# Patient Record
Sex: Male | Born: 1935 | Race: White | Hispanic: No | Marital: Married | Smoking: Former smoker
Health system: Southern US, Community
[De-identification: ages and names within clinical notes are randomized; demographics above are authoritative.]

## PROBLEM LIST (undated history)

## (undated) DIAGNOSIS — C4359 Malignant melanoma of other part of trunk: Secondary | ICD-10-CM

## (undated) DIAGNOSIS — M1990 Unspecified osteoarthritis, unspecified site: Secondary | ICD-10-CM

## (undated) DIAGNOSIS — E039 Hypothyroidism, unspecified: Secondary | ICD-10-CM

## (undated) DIAGNOSIS — I10 Essential (primary) hypertension: Secondary | ICD-10-CM

## (undated) DIAGNOSIS — K219 Gastro-esophageal reflux disease without esophagitis: Secondary | ICD-10-CM

## (undated) HISTORY — PX: TONSILLECTOMY: SUR1361

## (undated) HISTORY — PX: JOINT REPLACEMENT: SHX530

## (undated) HISTORY — PX: MULTIPLE TOOTH EXTRACTIONS: SHX2053

## (undated) HISTORY — PX: KNEE CARTILAGE SURGERY: SHX688

## (undated) HISTORY — PX: MELANOMA EXCISION: SHX5266

## (undated) HISTORY — PX: COLONOSCOPY: SHX174

---

## 2016-03-24 ENCOUNTER — Ambulatory Visit (HOSPITAL_BASED_OUTPATIENT_CLINIC_OR_DEPARTMENT_OTHER)
Admission: RE | Admit: 2016-03-24 | Discharge: 2016-03-24 | Disposition: A | Discharge status: Home / Self Care | Payer: Medicare HMO | Source: Ambulatory Visit | Attending: Orthopaedic Surgery | Admitting: Orthopaedic Surgery

## 2016-03-24 ENCOUNTER — Other Ambulatory Visit (INDEPENDENT_AMBULATORY_CARE_PROVIDER_SITE_OTHER): Payer: Self-pay | Admitting: Orthopaedic Surgery

## 2016-03-24 ENCOUNTER — Ambulatory Visit (INDEPENDENT_AMBULATORY_CARE_PROVIDER_SITE_OTHER): Payer: Medicare HMO | Admitting: Orthopaedic Surgery

## 2016-03-24 DIAGNOSIS — M1711 Unilateral primary osteoarthritis, right knee: Secondary | ICD-10-CM | POA: Insufficient documentation

## 2016-03-24 DIAGNOSIS — R52 Pain, unspecified: Secondary | ICD-10-CM

## 2016-03-24 DIAGNOSIS — M25461 Effusion, right knee: Secondary | ICD-10-CM | POA: Diagnosis not present

## 2016-03-24 DIAGNOSIS — M11261 Other chondrocalcinosis, right knee: Secondary | ICD-10-CM | POA: Insufficient documentation

## 2016-03-24 DIAGNOSIS — M25569 Pain in unspecified knee: Secondary | ICD-10-CM | POA: Diagnosis present

## 2016-03-24 DIAGNOSIS — M25561 Pain in right knee: Secondary | ICD-10-CM

## 2016-04-21 ENCOUNTER — Ambulatory Visit (INDEPENDENT_AMBULATORY_CARE_PROVIDER_SITE_OTHER): Payer: Medicare HMO | Admitting: Orthopaedic Surgery

## 2016-04-21 DIAGNOSIS — M1711 Unilateral primary osteoarthritis, right knee: Secondary | ICD-10-CM

## 2016-04-21 MED ORDER — HYALURONAN 88 MG/4ML IX SOSY
88.0000 mg | PREFILLED_SYRINGE | INTRA_ARTICULAR | Status: AC | PRN
  Administered 2016-04-21: 88 mg via INTRA_ARTICULAR

## 2016-04-21 NOTE — Progress Notes (Signed)
   Procedure Note  Patient: Shane Jordan             Date of Birth: November 22, 1932           MRN: [id number]             Visit Date: 04/21/2016  Procedures: Visit Diagnoses: Osteoarthritis of right knee, unspecified osteoarthritis type - Plan: Large Joint Injection/Arthrocentesis  Large Joint Inj Date/Time: 04/21/2016 8:40 AM Performed by: Anise Salvo Authorized by: Mcarthur Rossetti   Consent Given by:  Patient Indications:  Pain Location:  Knee Site:  R knee Needle Size:  22 G Ultrasound Guidance: No   Fluoroscopic Guidance: No   Arthrogram: No   Medications:  88 mg Hyaluronan 88 MG/4ML

## 2016-04-21 NOTE — Progress Notes (Signed)
   Office Visit Note   Patient: Shane Jordan           Date of Birth: 1932-08-15           MRN: [id number] Visit Date: 04/21/2016              Requested by: Reita Cliche, MD No address on file PCP: Reita Cliche, MD   Assessment & Plan: Visit Diagnoses:  1. Osteoarthritis of right knee, unspecified osteoarthritis type     Plan: He tolerated the monomer this injection in his right knee without difficulty. He is more [place] strengthening exercises. I will see him back in about 6 weeks to see if this is adding effect on him at all.  Follow-Up Instructions: Return in about 6 weeks (around 06/02/2016).   Orders:  Orders Placed This Encounter  Procedures  . Large Joint Injection/Arthrocentesis   No orders of the defined types were placed in this encounter.     Procedures: No procedures performed   Clinical Data: No additional findings.   Subjective: No chief complaint on file.   HPI  Review of Systems   Objective: Vital Signs: There were no vitals taken for this visit.  Physical Exam  Ortho Exam  Specialty Comments:  No specialty comments available.  Imaging: No results found.   PMFS History: There are no active problems to display for this patient.  No past medical history on file.  No family history on file.  No past surgical history on file. Social History   Occupational History  . Not on file.   Social History Main Topics  . Smoking status: Not on file  . Smokeless tobacco: Not on file  . Alcohol use Not on file  . Drug use: Unknown  . Sexual activity: Not on file

## 2016-06-16 ENCOUNTER — Ambulatory Visit (INDEPENDENT_AMBULATORY_CARE_PROVIDER_SITE_OTHER): Payer: Medicare HMO | Admitting: Orthopaedic Surgery

## 2016-06-16 DIAGNOSIS — M1711 Unilateral primary osteoarthritis, right knee: Secondary | ICD-10-CM

## 2016-06-16 DIAGNOSIS — G8929 Other chronic pain: Secondary | ICD-10-CM | POA: Diagnosis not present

## 2016-06-16 DIAGNOSIS — M25561 Pain in right knee: Secondary | ICD-10-CM | POA: Diagnosis not present

## 2016-06-16 NOTE — Progress Notes (Signed)
The patient is a very pleasant 83 year old well known to me. He has no significant active medical problems. We've been following him for severe osteoarthritis and degenerative joint disease of his right knee. He is tried and failed all forms conservative treatment including activity modification, rest, anti-inflammatories, intra-articular steroid injection, and a hyaluronic acid injection. His pain is daily. His detrimentally affected his activities daily living, his mobility, and his quality of life. He can be 10 out of 10 at times now. Is been going on for several years but worsening over the last several months. He is an avid golfer and was to be active again. At this point he was consider knee replacement surgery.  He denies any chest pain, shortness of breath, fever, chills, nausea, vomiting.  He is alert and oriented 3. Examination of his right knee shows patellofemoral crepitation and a varus deformity. He lacks full extension by a few degrees but I can flex him back 120. His knee feels ligaments stable. There is no significant effusion.  We did go over his x-rays again show this extensive tricompartmental arthritis involving his right knee.  At this point he does wish proceed knee replaced surgery. I'll long and thorough discussion showing him his knee x-rays and knee models as well as pictures of knee replacements. We discussed what is intraoperative and postoperative course and be. A thorough discussion of risk and benefits of surgery was had as well. He does wish to have this set up in the near future. We will work on getting this scheduled no we will see him back in 2 weeks postoperative but no x-rays needed.

## 2016-07-08 ENCOUNTER — Other Ambulatory Visit (INDEPENDENT_AMBULATORY_CARE_PROVIDER_SITE_OTHER): Payer: Self-pay | Admitting: Physician Assistant

## 2016-07-10 NOTE — Pre-Procedure Instructions (Signed)
Shane Jordan  07/10/2016      [place], [place] - [address] [place] [address] Phone: [phone] Fax: [phone]    Your procedure is scheduled on February 13  Report to [facility] at 1050 A.M.  Call this number if you have problems the morning of surgery:  [phone]   Remember:  Do not eat food or drink liquids after midnight.   Take these medicines the morning of surgery with A SIP OF WATER amLODipine (NORVASC) levothyroxine (SYNTHROID, LEVOTHROID)  7 days prior to surgery STOP taking any Aspirin, Aleve, Naproxen, Ibuprofen, Motrin, Advil, Goody's, BC's, all herbal medications, fish oil, and all vitamins    Do not wear jewelry.  Do not wear lotions, powders, or cologne, or deoderant.  Men may shave face and neck.  Do not bring valuables to the hospital.  [facility] is not responsible for any belongings or valuables.  Contacts, dentures or bridgework may not be worn into surgery.  Leave your suitcase in the car.  After surgery it may be brought to your room.  For patients admitted to the hospital, discharge time will be determined by your treatment team.  Patients discharged the day of surgery will not be allowed to drive home.    Special instructions:   [facility]- Preparing For Surgery  Before surgery, you can play an important role. Because skin is not sterile, your skin needs to be as free of germs as possible. You can reduce the number of germs on your skin by washing with CHG (chlorahexidine gluconate) Soap before surgery.  CHG is an antiseptic cleaner which kills germs and bonds with the skin to continue killing germs even after washing.  Please do not use if you have an allergy to CHG or antibacterial soaps. If your skin becomes reddened/irritated stop using the CHG.  Do not shave (including legs and underarms) for at least 48 hours prior to first CHG shower. It is OK to shave your  face.  Please follow these instructions carefully.   1. Shower the NIGHT BEFORE SURGERY and the MORNING OF SURGERY with CHG.   2. If you chose to wash your hair, wash your hair first as usual with your normal shampoo.  3. After you shampoo, rinse your hair and body thoroughly to remove the shampoo.  4. Use CHG as you would any other liquid soap. You can apply CHG directly to the skin and wash gently with a scrungie or a clean washcloth.   5. Apply the CHG Soap to your body ONLY FROM THE NECK DOWN.  Do not use on open wounds or open sores. Avoid contact with your eyes, ears, mouth and genitals (private parts). Wash genitals (private parts) with your normal soap.  6. Wash thoroughly, paying special attention to the area where your surgery will be performed.  7. Thoroughly rinse your body with warm water from the neck down.  8. DO NOT shower/wash with your normal soap after using and rinsing off the CHG Soap.  9. Pat yourself dry with a CLEAN TOWEL.   10. Wear CLEAN PAJAMAS   11. Place CLEAN SHEETS on your bed the night of your first shower and DO NOT SLEEP WITH PETS.    Day of Surgery: Do not apply any deodorants/lotions. Please wear clean clothes to the hospital/surgery center.      Please read over the following fact sheets that you were given.

## 2016-07-11 ENCOUNTER — Encounter (HOSPITAL_COMMUNITY): Payer: Self-pay

## 2016-07-11 ENCOUNTER — Encounter (HOSPITAL_COMMUNITY)
Admission: RE | Admit: 2016-07-11 | Discharge: 2016-07-11 | Disposition: A | Discharge status: Home / Self Care | Payer: Medicare HMO | Source: Ambulatory Visit | Attending: Orthopaedic Surgery | Admitting: Orthopaedic Surgery

## 2016-07-11 DIAGNOSIS — K219 Gastro-esophageal reflux disease without esophagitis: Secondary | ICD-10-CM | POA: Diagnosis not present

## 2016-07-11 DIAGNOSIS — Z85828 Personal history of other malignant neoplasm of skin: Secondary | ICD-10-CM | POA: Insufficient documentation

## 2016-07-11 DIAGNOSIS — Z01812 Encounter for preprocedural laboratory examination: Secondary | ICD-10-CM | POA: Diagnosis present

## 2016-07-11 DIAGNOSIS — I10 Essential (primary) hypertension: Secondary | ICD-10-CM | POA: Insufficient documentation

## 2016-07-11 DIAGNOSIS — I252 Old myocardial infarction: Secondary | ICD-10-CM | POA: Diagnosis not present

## 2016-07-11 DIAGNOSIS — E039 Hypothyroidism, unspecified: Secondary | ICD-10-CM | POA: Insufficient documentation

## 2016-07-11 DIAGNOSIS — R001 Bradycardia, unspecified: Secondary | ICD-10-CM | POA: Diagnosis not present

## 2016-07-11 DIAGNOSIS — M1990 Unspecified osteoarthritis, unspecified site: Secondary | ICD-10-CM | POA: Diagnosis not present

## 2016-07-11 DIAGNOSIS — Z01810 Encounter for preprocedural cardiovascular examination: Secondary | ICD-10-CM | POA: Insufficient documentation

## 2016-07-11 HISTORY — DX: Hypothyroidism, unspecified: E03.9

## 2016-07-11 HISTORY — DX: Unspecified osteoarthritis, unspecified site: M19.90

## 2016-07-11 HISTORY — DX: Gastro-esophageal reflux disease without esophagitis: K21.9

## 2016-07-11 HISTORY — DX: Essential (primary) hypertension: I10

## 2016-07-11 LAB — CBC
HCT: 40.3 % (ref 39.0–52.0)
HEMOGLOBIN: 13.4 g/dL (ref 13.0–17.0)
MCH: 31.2 pg (ref 26.0–34.0)
MCHC: 33.3 g/dL (ref 30.0–36.0)
MCV: 93.9 fL (ref 78.0–100.0)
PLATELETS: 181 10*3/uL (ref 150–400)
RBC: 4.29 MIL/uL (ref 4.22–5.81)
RDW: 13.8 % (ref 11.5–15.5)
WBC: 5.9 10*3/uL (ref 4.0–10.5)

## 2016-07-11 LAB — BASIC METABOLIC PANEL
ANION GAP: 6 (ref 5–15)
BUN: 14 mg/dL (ref 6–20)
CALCIUM: 9.3 mg/dL (ref 8.9–10.3)
CO2: 26 mmol/L (ref 22–32)
CREATININE: 1 mg/dL (ref 0.61–1.24)
Chloride: 105 mmol/L (ref 101–111)
GFR calc Af Amer: >60 mL/min (ref >60)
GLUCOSE: 91 mg/dL (ref 65–99)
Potassium: 4.5 mmol/L (ref 3.5–5.1)
Sodium: 137 mmol/L (ref 135–145)

## 2016-07-11 LAB — SURGICAL PCR SCREEN
MRSA, PCR: NEGATIVE
Staphylococcus aureus: NEGATIVE

## 2016-07-11 NOTE — Progress Notes (Signed)
PCP - Reita Cliche Cardiologist - denies  Chest x-ray - not needed EKG - 07/11/16 Stress Test ->30 years   ECHO - 30 years Cardiac Cath - not needed Sleep study > 25 years  Requesting records from PCP will send to anesthesia for review of those records   Patient denies shortness of breath, fever, cough and chest pain at PAT appointment   Patient verbalized understanding of instructions that was given to them at the PAT appointment. Patient expressed that there were no further questions.  Patient was also instructed that they will need to review over the PAT instructions again at home before the surgery.

## 2016-07-21 MED ORDER — TRANEXAMIC ACID 1000 MG/10ML IV SOLN
1000.0000 mg | INTRAVENOUS | Status: AC
  Administered 2016-07-22: 1000 mg via INTRAVENOUS
  Filled 2016-07-21: qty 10

## 2016-07-22 ENCOUNTER — Encounter (HOSPITAL_COMMUNITY)
Admission: RE | Disposition: A | Discharge status: Home Health | Payer: Self-pay | Source: Ambulatory Visit | Attending: Orthopaedic Surgery

## 2016-07-22 ENCOUNTER — Encounter (HOSPITAL_COMMUNITY): Payer: Self-pay | Admitting: Certified Registered Nurse Anesthetist

## 2016-07-22 ENCOUNTER — Inpatient Hospital Stay (HOSPITAL_COMMUNITY): Payer: Medicare HMO | Admitting: Emergency Medicine

## 2016-07-22 ENCOUNTER — Inpatient Hospital Stay (HOSPITAL_COMMUNITY): Payer: Medicare HMO | Admitting: Certified Registered Nurse Anesthetist

## 2016-07-22 ENCOUNTER — Inpatient Hospital Stay (HOSPITAL_COMMUNITY): Payer: Medicare HMO

## 2016-07-22 ENCOUNTER — Inpatient Hospital Stay (HOSPITAL_COMMUNITY)
Admission: RE | Admit: 2016-07-22 | Discharge: 2016-07-24 | DRG: 470 | Disposition: A | Discharge status: Home Health | Payer: Medicare HMO | Source: Ambulatory Visit | Attending: Orthopaedic Surgery | Admitting: Orthopaedic Surgery

## 2016-07-22 DIAGNOSIS — Z85820 Personal history of malignant melanoma of skin: Secondary | ICD-10-CM | POA: Diagnosis not present

## 2016-07-22 DIAGNOSIS — Z87891 Personal history of nicotine dependence: Secondary | ICD-10-CM | POA: Diagnosis not present

## 2016-07-22 DIAGNOSIS — E039 Hypothyroidism, unspecified: Secondary | ICD-10-CM | POA: Diagnosis present

## 2016-07-22 DIAGNOSIS — K219 Gastro-esophageal reflux disease without esophagitis: Secondary | ICD-10-CM | POA: Diagnosis present

## 2016-07-22 DIAGNOSIS — M1711 Unilateral primary osteoarthritis, right knee: Secondary | ICD-10-CM | POA: Diagnosis present

## 2016-07-22 DIAGNOSIS — I10 Essential (primary) hypertension: Secondary | ICD-10-CM | POA: Diagnosis present

## 2016-07-22 DIAGNOSIS — Z79899 Other long term (current) drug therapy: Secondary | ICD-10-CM

## 2016-07-22 DIAGNOSIS — Z96651 Presence of right artificial knee joint: Secondary | ICD-10-CM

## 2016-07-22 HISTORY — PX: TOTAL KNEE ARTHROPLASTY: SHX125

## 2016-07-22 HISTORY — DX: Malignant melanoma of other part of trunk: C43.59

## 2016-07-22 SURGERY — ARTHROPLASTY, KNEE, TOTAL
Anesthesia: Spinal | Site: Knee | Laterality: Right

## 2016-07-22 MED ORDER — PROMETHAZINE HCL 25 MG/ML IJ SOLN: 6.2500 mg | INTRAMUSCULAR | Status: DC | PRN

## 2016-07-22 MED ORDER — EZETIMIBE 10 MG PO TABS
10.0000 mg | ORAL_TABLET | Freq: Every day | ORAL | Status: DC
  Administered 2016-07-23 – 2016-07-24 (×2): 10 mg via ORAL
  Filled 2016-07-22 (×3): qty 1

## 2016-07-22 MED ORDER — PROPOFOL 500 MG/50ML IV EMUL
INTRAVENOUS | Status: DC | PRN
  Administered 2016-07-22: 50 ug/kg/min via INTRAVENOUS

## 2016-07-22 MED ORDER — PANTOPRAZOLE SODIUM 40 MG PO TBEC
80.0000 mg | DELAYED_RELEASE_TABLET | Freq: Every day | ORAL | Status: DC
  Administered 2016-07-23 – 2016-07-24 (×2): 80 mg via ORAL
  Filled 2016-07-22 (×3): qty 2

## 2016-07-22 MED ORDER — ACETAMINOPHEN 325 MG PO TABS: 650.0000 mg | ORAL_TABLET | Freq: Four times a day (QID) | ORAL | Status: DC | PRN

## 2016-07-22 MED ORDER — CHLORHEXIDINE GLUCONATE 4 % EX LIQD: 60.0000 mL | Freq: Once | CUTANEOUS | Status: DC

## 2016-07-22 MED ORDER — FENTANYL CITRATE (PF) 100 MCG/2ML IJ SOLN
INTRAMUSCULAR | Status: AC
  Filled 2016-07-22: qty 2

## 2016-07-22 MED ORDER — DIPHENHYDRAMINE HCL 12.5 MG/5ML PO ELIX
12.5000 mg | ORAL_SOLUTION | ORAL | Status: DC | PRN
  Administered 2016-07-23: 25 mg via ORAL
  Filled 2016-07-22: qty 10

## 2016-07-22 MED ORDER — ROPIVACAINE HCL 7.5 MG/ML IJ SOLN
INTRAMUSCULAR | Status: DC | PRN
  Administered 2016-07-22: 20 mL via PERINEURAL

## 2016-07-22 MED ORDER — DOCUSATE SODIUM 100 MG PO CAPS
100.0000 mg | ORAL_CAPSULE | Freq: Two times a day (BID) | ORAL | Status: DC
  Administered 2016-07-22 – 2016-07-24 (×4): 100 mg via ORAL
  Filled 2016-07-22 (×4): qty 1

## 2016-07-22 MED ORDER — IRBESARTAN 300 MG PO TABS
300.0000 mg | ORAL_TABLET | Freq: Every day | ORAL | Status: DC
  Administered 2016-07-23 – 2016-07-24 (×2): 300 mg via ORAL
  Filled 2016-07-22 (×3): qty 1

## 2016-07-22 MED ORDER — METOCLOPRAMIDE HCL 5 MG/ML IJ SOLN: 5.0000 mg | Freq: Three times a day (TID) | INTRAMUSCULAR | Status: DC | PRN

## 2016-07-22 MED ORDER — AMLODIPINE BESYLATE 10 MG PO TABS
10.0000 mg | ORAL_TABLET | Freq: Every day | ORAL | Status: DC
  Administered 2016-07-23 – 2016-07-24 (×2): 10 mg via ORAL
  Filled 2016-07-22 (×2): qty 1

## 2016-07-22 MED ORDER — SODIUM CHLORIDE 0.9 % IV SOLN: INTRAVENOUS | Status: DC | PRN

## 2016-07-22 MED ORDER — SODIUM CHLORIDE 0.9 % IV SOLN
INTRAVENOUS | Status: DC
  Administered 2016-07-22: 15:00:00 via INTRAVENOUS

## 2016-07-22 MED ORDER — HYDROMORPHONE HCL 2 MG/ML IJ SOLN
0.5000 mg | INTRAMUSCULAR | Status: DC | PRN
  Administered 2016-07-22: 0.5 mg via INTRAVENOUS
  Filled 2016-07-22: qty 1

## 2016-07-22 MED ORDER — CEFAZOLIN SODIUM-DEXTROSE 2-4 GM/100ML-% IV SOLN
INTRAVENOUS | Status: AC
  Filled 2016-07-22: qty 100

## 2016-07-22 MED ORDER — ONDANSETRON HCL 4 MG PO TABS
4.0000 mg | ORAL_TABLET | Freq: Four times a day (QID) | ORAL | Status: DC | PRN
  Administered 2016-07-22: 4 mg via ORAL
  Filled 2016-07-22: qty 1

## 2016-07-22 MED ORDER — MIDAZOLAM HCL 2 MG/2ML IJ SOLN
INTRAMUSCULAR | Status: AC
  Filled 2016-07-22: qty 2

## 2016-07-22 MED ORDER — MENTHOL 3 MG MT LOZG: 1.0000 | LOZENGE | OROMUCOSAL | Status: DC | PRN

## 2016-07-22 MED ORDER — PHENOL 1.4 % MT LIQD: 1.0000 | OROMUCOSAL | Status: DC | PRN

## 2016-07-22 MED ORDER — OXYCODONE HCL 5 MG PO TABS
5.0000 mg | ORAL_TABLET | ORAL | Status: DC | PRN
  Administered 2016-07-22: 10 mg via ORAL
  Administered 2016-07-22: 5 mg via ORAL
  Administered 2016-07-23 (×4): 10 mg via ORAL
  Administered 2016-07-23 – 2016-07-24 (×2): 5 mg via ORAL
  Filled 2016-07-22: qty 2
  Filled 2016-07-22: qty 1
  Filled 2016-07-22: qty 2
  Filled 2016-07-22: qty 1
  Filled 2016-07-22: qty 2
  Filled 2016-07-22 (×2): qty 1
  Filled 2016-07-22 (×2): qty 2

## 2016-07-22 MED ORDER — LEVOTHYROXINE SODIUM 50 MCG PO TABS
50.0000 ug | ORAL_TABLET | Freq: Every day | ORAL | Status: DC
  Administered 2016-07-23 – 2016-07-24 (×2): 50 ug via ORAL
  Filled 2016-07-22 (×2): qty 1

## 2016-07-22 MED ORDER — EPHEDRINE SULFATE 50 MG/ML IJ SOLN
INTRAMUSCULAR | Status: DC | PRN
  Administered 2016-07-22 (×2): 10 mg via INTRAVENOUS
  Administered 2016-07-22 (×3): 5 mg via INTRAVENOUS

## 2016-07-22 MED ORDER — BUPIVACAINE HCL (PF) 0.75 % IJ SOLN
INTRAMUSCULAR | Status: DC | PRN
  Administered 2016-07-22: 1.8 mL via INTRATHECAL

## 2016-07-22 MED ORDER — METHOCARBAMOL 500 MG PO TABS
500.0000 mg | ORAL_TABLET | Freq: Four times a day (QID) | ORAL | Status: DC | PRN
  Administered 2016-07-23 (×4): 500 mg via ORAL
  Filled 2016-07-22 (×5): qty 1

## 2016-07-22 MED ORDER — METHOCARBAMOL 1000 MG/10ML IJ SOLN
500.0000 mg | Freq: Four times a day (QID) | INTRAVENOUS | Status: DC | PRN
  Filled 2016-07-22: qty 5

## 2016-07-22 MED ORDER — RIVAROXABAN 10 MG PO TABS
10.0000 mg | ORAL_TABLET | Freq: Every day | ORAL | Status: DC
  Administered 2016-07-23 – 2016-07-24 (×2): 10 mg via ORAL
  Filled 2016-07-22 (×2): qty 1

## 2016-07-22 MED ORDER — MIDAZOLAM HCL 2 MG/2ML IJ SOLN
INTRAMUSCULAR | Status: AC
  Administered 2016-07-22: 1 mg
  Filled 2016-07-22: qty 2

## 2016-07-22 MED ORDER — FENTANYL CITRATE (PF) 100 MCG/2ML IJ SOLN
INTRAMUSCULAR | Status: AC
  Administered 2016-07-22: 50 ug
  Filled 2016-07-22: qty 2

## 2016-07-22 MED ORDER — CEFAZOLIN SODIUM-DEXTROSE 2-4 GM/100ML-% IV SOLN
2.0000 g | INTRAVENOUS | Status: AC
  Administered 2016-07-22: 2 g via INTRAVENOUS

## 2016-07-22 MED ORDER — HYDROMORPHONE HCL 1 MG/ML IJ SOLN: 0.5000 mg | INTRAMUSCULAR | Status: DC | PRN

## 2016-07-22 MED ORDER — HYDROMORPHONE HCL 1 MG/ML IJ SOLN: 0.2500 mg | INTRAMUSCULAR | Status: DC | PRN

## 2016-07-22 MED ORDER — ONDANSETRON HCL 4 MG/2ML IJ SOLN: 4.0000 mg | Freq: Four times a day (QID) | INTRAMUSCULAR | Status: DC | PRN

## 2016-07-22 MED ORDER — ALUM & MAG HYDROXIDE-SIMETH 200-200-20 MG/5ML PO SUSP: 30.0000 mL | ORAL | Status: DC | PRN

## 2016-07-22 MED ORDER — LACTATED RINGERS IV SOLN
INTRAVENOUS | Status: DC
  Administered 2016-07-22: 11:00:00 via INTRAVENOUS

## 2016-07-22 MED ORDER — CEFAZOLIN IN D5W 1 GM/50ML IV SOLN
1.0000 g | Freq: Four times a day (QID) | INTRAVENOUS | Status: AC
  Administered 2016-07-22 (×2): 1 g via INTRAVENOUS
  Filled 2016-07-22 (×2): qty 50

## 2016-07-22 MED ORDER — ACETAMINOPHEN 650 MG RE SUPP: 650.0000 mg | Freq: Four times a day (QID) | RECTAL | Status: DC | PRN

## 2016-07-22 MED ORDER — 0.9 % SODIUM CHLORIDE (POUR BTL) OPTIME
TOPICAL | Status: DC | PRN
  Administered 2016-07-22: 1000 mL

## 2016-07-22 MED ORDER — METOCLOPRAMIDE HCL 5 MG PO TABS: 5.0000 mg | ORAL_TABLET | Freq: Three times a day (TID) | ORAL | Status: DC | PRN

## 2016-07-22 MED ORDER — SODIUM CHLORIDE 0.9 % IR SOLN
Status: DC | PRN
  Administered 2016-07-22: 3000 mL

## 2016-07-22 SURGICAL SUPPLY — 68 items
APL SKNCLS STERI-STRIP NONHPOA (GAUZE/BANDAGES/DRESSINGS) ×1
BANDAGE ACE 6X5 VEL STRL LF (GAUZE/BANDAGES/DRESSINGS) ×3 IMPLANT
BANDAGE ESMARK 6X9 LF (GAUZE/BANDAGES/DRESSINGS) ×1 IMPLANT
BENZOIN TINCTURE PRP APPL 2/3 (GAUZE/BANDAGES/DRESSINGS) ×1 IMPLANT
BLADE SAG 18X100X1.27 (BLADE) ×3 IMPLANT
BNDG CMPR 9X6 STRL LF SNTH (GAUZE/BANDAGES/DRESSINGS) ×1
BNDG ESMARK 6X9 LF (GAUZE/BANDAGES/DRESSINGS) ×2
BOWL SMART MIX CTS (DISPOSABLE) ×2 IMPLANT
CAPT KNEE TOTAL 3 ×1 IMPLANT
CEMENT BONE SIMPLEX SPEEDSET (Cement) ×4 IMPLANT
COVER SURGICAL LIGHT HANDLE (MISCELLANEOUS) ×2 IMPLANT
CUFF TOURNIQUET SINGLE 34IN LL (TOURNIQUET CUFF) ×2 IMPLANT
CUFF TOURNIQUET SINGLE 44IN (TOURNIQUET CUFF) IMPLANT
DRAPE EXTREMITY T 121X128X90 (DRAPE) ×2 IMPLANT
DRAPE PROXIMA HALF (DRAPES) ×2 IMPLANT
DRAPE U-SHAPE 47X51 STRL (DRAPES) ×2 IMPLANT
DRSG PAD ABDOMINAL 8X10 ST (GAUZE/BANDAGES/DRESSINGS) ×2 IMPLANT
DURAPREP 26ML APPLICATOR (WOUND CARE) ×3 IMPLANT
ELECT CAUTERY BLADE 6.4 (BLADE) ×2 IMPLANT
ELECT REM PT RETURN 9FT ADLT (ELECTROSURGICAL) ×2
ELECTRODE REM PT RTRN 9FT ADLT (ELECTROSURGICAL) ×1 IMPLANT
FACESHIELD WRAPAROUND (MASK) ×4 IMPLANT
FACESHIELD WRAPAROUND OR TEAM (MASK) ×2 IMPLANT
GAUZE SPONGE 4X4 12PLY STRL (GAUZE/BANDAGES/DRESSINGS) ×2 IMPLANT
GAUZE XEROFORM 1X8 LF (GAUZE/BANDAGES/DRESSINGS) ×2 IMPLANT
GLOVE BIOGEL PI IND STRL 7.0 (GLOVE) IMPLANT
GLOVE BIOGEL PI IND STRL 8 (GLOVE) ×2 IMPLANT
GLOVE BIOGEL PI INDICATOR 7.0 (GLOVE) ×1
GLOVE BIOGEL PI INDICATOR 8 (GLOVE) ×4
GLOVE BIOGEL PI ORTHO PRO SZ7 (GLOVE) ×1
GLOVE ORTHO TXT STRL SZ7.5 (GLOVE) ×2 IMPLANT
GLOVE PI ORTHO PRO STRL SZ7 (GLOVE) IMPLANT
GLOVE SURG ORTHO 8.0 STRL STRW (GLOVE) ×2 IMPLANT
GOWN STRL REUS W/ TWL LRG LVL3 (GOWN DISPOSABLE) IMPLANT
GOWN STRL REUS W/ TWL XL LVL3 (GOWN DISPOSABLE) ×2 IMPLANT
GOWN STRL REUS W/TWL LRG LVL3 (GOWN DISPOSABLE)
GOWN STRL REUS W/TWL XL LVL3 (GOWN DISPOSABLE) ×4
HANDPIECE INTERPULSE COAX TIP (DISPOSABLE) ×4
IMMOBILIZER KNEE 22 UNIV (SOFTGOODS) ×2 IMPLANT
KIT BASIN OR (CUSTOM PROCEDURE TRAY) ×2 IMPLANT
KIT ROOM TURNOVER OR (KITS) ×2 IMPLANT
MANIFOLD NEPTUNE II (INSTRUMENTS) ×2 IMPLANT
NDL SAFETY ECLIPSE 18X1.5 (NEEDLE) IMPLANT
NEEDLE HYPO 18GX1.5 SHARP (NEEDLE)
NS IRRIG 1000ML POUR BTL (IV SOLUTION) ×2 IMPLANT
PACK TOTAL JOINT (CUSTOM PROCEDURE TRAY) ×2 IMPLANT
PAD ABD 8X10 STRL (GAUZE/BANDAGES/DRESSINGS) ×2 IMPLANT
PAD ARMBOARD 7.5X6 YLW CONV (MISCELLANEOUS) ×2 IMPLANT
PADDING CAST COTTON 6X4 STRL (CAST SUPPLIES) ×3 IMPLANT
SET HNDPC FAN SPRY TIP SCT (DISPOSABLE) ×1 IMPLANT
SET PAD KNEE POSITIONER (MISCELLANEOUS) ×3 IMPLANT
SPONGE GAUZE 4X4 12PLY STER LF (GAUZE/BANDAGES/DRESSINGS) ×1 IMPLANT
STAPLER VISISTAT 35W (STAPLE) IMPLANT
STRIP CLOSURE SKIN 1/2X4 (GAUZE/BANDAGES/DRESSINGS) ×2 IMPLANT
SUCTION FRAZIER HANDLE 10FR (MISCELLANEOUS) ×1
SUCTION TUBE FRAZIER 10FR DISP (MISCELLANEOUS) ×1 IMPLANT
SUT MNCRL AB 4-0 PS2 18 (SUTURE) ×1 IMPLANT
SUT VIC AB 0 CT1 27 (SUTURE) ×2
SUT VIC AB 0 CT1 27XBRD ANBCTR (SUTURE) ×1 IMPLANT
SUT VIC AB 1 CT1 27 (SUTURE) ×4
SUT VIC AB 1 CT1 27XBRD ANBCTR (SUTURE) ×2 IMPLANT
SUT VIC AB 2-0 CT1 27 (SUTURE) ×4
SUT VIC AB 2-0 CT1 TAPERPNT 27 (SUTURE) ×2 IMPLANT
SYR 50ML LL SCALE MARK (SYRINGE) IMPLANT
TOWEL OR 17X24 6PK STRL BLUE (TOWEL DISPOSABLE) ×2 IMPLANT
TOWEL OR 17X26 10 PK STRL BLUE (TOWEL DISPOSABLE) ×2 IMPLANT
TRAY CATH 16FR W/PLASTIC CATH (SET/KITS/TRAYS/PACK) ×1 IMPLANT
WRAP KNEE MAXI GEL POST OP (GAUZE/BANDAGES/DRESSINGS) ×2 IMPLANT

## 2016-07-22 NOTE — Transfer of Care (Signed)
Immediate Anesthesia Transfer of Care Note  Patient: Shane Jordan  Procedure(s) Performed: Procedure(s): RIGHT TOTAL KNEE ARTHROPLASTY (Right)  Patient Location: PACU  Anesthesia Type:Spinal  Level of Consciousness: awake, alert , oriented and patient cooperative  Airway & Oxygen Therapy: Patient Spontanous Breathing and Patient connected to nasal cannula oxygen  Post-op Assessment: Report given to RN and Post -op Vital signs reviewed and stable  Post vital signs: Reviewed and stable  Last Vitals:  Vitals:   07/22/16 1039 07/22/16 1426  BP: (!) 159/73 108/61  Pulse: (!) 52 63  Resp: 20 13  Temp: 36.8 C 36.1 C    Last Pain:  Vitals:   07/22/16 1426  TempSrc:   PainSc: (P) 0-No pain         Complications: No apparent anesthesia complications

## 2016-07-22 NOTE — Brief Op Note (Signed)
07/22/2016  1:53 PM  PATIENT:  Shane Jordan  83 y.o. male  PRE-OPERATIVE DIAGNOSIS:  severe osteoarthritis right knee  POST-OPERATIVE DIAGNOSIS:  severe osteoarthritis right knee  PROCEDURE:  Procedure(s): RIGHT TOTAL KNEE ARTHROPLASTY (Right)  SURGEON:  Surgeon(s) and Role:    * Mcarthur Rossetti, MD - Primary  PHYSICIAN ASSISTANT: Benita Stabile, PA-C  ANESTHESIA:   regional and spinal  EBL:  Total I/O In: -  Out: 250 [Urine:150; Blood:100]  COUNTS:  YES  TOURNIQUET:   Total Tourniquet Time Documented: Thigh (Right) - 49 minutes Total: Thigh (Right) - 49 minutes   DICTATION: .Other Dictation: Dictation Number [id number]  PLAN OF CARE: Admit to inpatient   PATIENT DISPOSITION:  PACU - hemodynamically stable.   Delay start of Pharmacological VTE agent (>24hrs) due to surgical blood loss or risk of bleeding: no

## 2016-07-22 NOTE — Evaluation (Signed)
Physical Therapy Evaluation Patient Details Name: Shane Jordan MRN: [id number] DOB: 12-17-1932 Today's Date: 07/22/2016   History of Present Illness  83 y.o. male admitted to [facility] on 07/22/16 for elective R TKA.  Pt with significant PMHx of HTN  Clinical Impression  Pt is POD #0 and moving very well.  He was able to walk a short distance down the hallway with min guard assist and RW this evening.  We started his knee education and did his first page of HEP exercises.  He will likely progress well enough to d/c home with his wife's assist.   PT to follow acutely for deficits listed below.       Follow Up Recommendations Home health PT;Supervision for mobility/OOB    Equipment Recommendations  None recommended by PT    Recommendations for Other Services   NA    Precautions / Restrictions Precautions Precautions: Knee Precaution Booklet Issued: Yes (comment) Precaution Comments: knee exercise handout given, no pillow under knee reviewed Restrictions Weight Bearing Restrictions: Yes RLE Weight Bearing: Weight bearing as tolerated      Mobility  Bed Mobility Overal bed mobility: Modified Independent             General bed mobility comments: HOB elevated and pt using railing, but able to get himself to EOB and progress his knee over EOB unassisted.   Transfers Overall transfer level: Needs assistance Equipment used: Rolling walker (2 wheeled) Transfers: Sit to/from Stand Sit to Stand: Min guard         General transfer comment: Min guard assist for safety verbal cues for safe hand placement.   Ambulation/Gait Ambulation/Gait assistance: Min guard Ambulation Distance (Feet): 60 Feet Assistive device: Rolling walker (2 wheeled) Gait Pattern/deviations: Step-through pattern;Antalgic Gait velocity: decreased   General Gait Details: Pt with moderately antalgic gait pattern, verbal cues for safe RW use and safe proximity to RW.  Pt did not report any lightheadedness or  nausea with OOB mobility.          Balance Overall balance assessment: Needs assistance Sitting-balance support: Feet supported;Bilateral upper extremity supported Sitting balance-Leahy Scale: Good     Standing balance support: Bilateral upper extremity supported Standing balance-Leahy Scale: Poor                               Pertinent Vitals/Pain Pain Assessment: 0-10 Pain Score: 6  Pain Location: right knee Pain Descriptors / Indicators: Aching;Burning Pain Intervention(s): Limited activity within patient's tolerance;Monitored during session;Repositioned    Home Living Family/patient expects to be discharged to:: Private residence Living Arrangements: Spouse/significant other Available Help at Discharge: Family;Available 24 hours/day Type of Home: House Home Access: Stairs to enter Entrance Stairs-Rails: None Entrance Stairs-Number of Steps: 3 Home Layout: Multi-level;Full bath on main level;Able to live on main level with bedroom/bathroom Home Equipment: Gilford Rile - 2 wheels;Bedside commode;Grab bars - tub/shower;Hand held shower head;Cane - single point      Prior Function Level of Independence: Independent         Comments: pt likes to play golf        Extremity/Trunk Assessment   Upper Extremity Assessment Upper Extremity Assessment: Defer to OT evaluation    Lower Extremity Assessment Lower Extremity Assessment: RLE deficits/detail RLE Deficits / Details: right leg with normal post op pain and weakness, ankle at least 4/5, knee 2/5, hip flexion 3-/5    Cervical / Trunk Assessment Cervical / Trunk Assessment: Normal  Communication  Communication: No difficulties  Cognition Arousal/Alertness: Awake/alert Behavior During Therapy: WFL for tasks assessed/performed Overall Cognitive Status: Within Functional Limits for tasks assessed                         Exercises Total Joint Exercises Ankle Circles/Pumps: AROM;Both;20  reps Quad Sets: AROM;Both;10 reps Towel Squeeze: AROM;Both;10 reps Heel Slides: AAROM;Right;10 reps   Assessment/Plan    PT Assessment Patient needs continued PT services  PT Problem List Decreased strength;Decreased range of motion;Decreased activity tolerance;Decreased mobility;Decreased balance;Decreased knowledge of use of DME;Decreased knowledge of precautions;Pain          PT Treatment Interventions DME instruction;Gait training;Stair training;Functional mobility training;Therapeutic activities;Therapeutic exercise;Balance training;Patient/family education;Manual techniques;Modalities    PT Goals (Current goals can be found in the Care Plan section)  Acute Rehab PT Goals Patient Stated Goal: to get back to golf PT Goal Formulation: With patient/family Time For Goal Achievement: 07/29/16 Potential to Achieve Goals: Good    Frequency 7X/week           End of Session Equipment Utilized During Treatment: Gait belt Activity Tolerance: Patient limited by pain Patient left: in chair;with call bell/phone within reach;with family/visitor present Nurse Communication: Mobility status         Time: [id number] PT Time Calculation (min) (ACUTE ONLY): 33 min   Charges:   PT Evaluation $PT Eval Low Complexity: 1 Procedure PT Treatments $Gait Training: 8-22 mins        Mileena Rothenberger B. Doland, [place], DPT [phone]   07/22/2016, 5:41 PM

## 2016-07-22 NOTE — H&P (Signed)
TOTAL KNEE ADMISSION H&P  Patient is being admitted for right total knee arthroplasty.  Subjective:  Chief Complaint:right knee pain.  HPI: Shane Jordan, 83 y.o. male, has a history of pain and functional disability in the right knee due to arthritis and has failed non-surgical conservative treatments for greater than 12 weeks to includeNSAID's and/or analgesics, corticosteriod injections, viscosupplementation injections, use of assistive devices and activity modification.  Onset of symptoms was gradual, starting 3 years ago with gradually worsening course since that time. The patient noted no past surgery on the right knee(s).  Patient currently rates pain in the right knee(s) at 10 out of 10 with activity. Patient has night pain, worsening of pain with activity and weight bearing, pain that interferes with activities of daily living, pain with passive range of motion, crepitus and joint swelling.  Patient has evidence of subchondral sclerosis, periarticular osteophytes and joint space narrowing by imaging studies. There is no active infection.  Patient Active Problem List   Diagnosis Date Noted  . Unilateral primary osteoarthritis, right knee 07/22/2016   Past Medical History:  Diagnosis Date  . Arthritis   . Cancer ([place])    skin cancer abdomen  . GERD (gastroesophageal reflux disease)   . Hypertension   . Hypothyroidism     Past Surgical History:  Procedure Laterality Date  . COLONOSCOPY    . KNEE SURGERY Left   . MULTIPLE TOOTH EXTRACTIONS    . TONSILLECTOMY      Prescriptions Prior to Admission  Medication Sig Dispense Refill Last Dose  . amLODipine (NORVASC) 10 MG tablet Take 10 mg by mouth daily.   07/22/2016 at Unknown time  . ezetimibe (ZETIA) 10 MG tablet Take 10 mg by mouth daily.    07/21/2016 at Unknown time  . levothyroxine (SYNTHROID, LEVOTHROID) 50 MCG tablet Take 50 mcg by mouth daily before breakfast.   07/22/2016 at Unknown time  . olmesartan (BENICAR) 40 MG  tablet Take 1 tablet by mouth daily.   07/21/2016 at Unknown time  . omeprazole (PRILOSEC) 40 MG capsule Take 40 mg by mouth as needed.   Past Week at Unknown time   Allergies  Allergen Reactions  . Hydrochlorothiazide Hives  . Statins Hives  . Sulfa Antibiotics Other (See Comments)  . Tylenol [Acetaminophen] Other (See Comments)  . Welchol [Colesevelam Hcl] Other (See Comments)    Social History  Substance Use Topics  . Smoking status: Former Smoker    Types: Cigars  . Smokeless tobacco: Former Systems developer    Types: Chew  . Alcohol use No    History reviewed. No pertinent family history.   Review of Systems  Musculoskeletal: Positive for joint pain.  All other systems reviewed and are negative.   Objective:  Physical Exam  Constitutional: He is oriented to person, place, and time. He appears well-developed and well-nourished.  HENT:  Head: Normocephalic and atraumatic.  Eyes: EOM are normal. Pupils are equal, round, and reactive to light.  Neck: Normal range of motion. Neck supple.  Cardiovascular: Normal rate and regular rhythm.   Respiratory: Effort normal and breath sounds normal.  GI: Soft. Bowel sounds are normal.  Musculoskeletal:       Right knee: He exhibits decreased range of motion, effusion and abnormal alignment. Tenderness found. Medial joint line and lateral joint line tenderness noted.  Neurological: He is alert and oriented to person, place, and time.  Skin: Skin is warm and dry.  Psychiatric: He has a normal mood and affect.  Vital signs in last 24 hours: Temp:  [98.2 F (36.8 C)] 98.2 F (36.8 C) (02/13 1039) Pulse Rate:  [52] 52 (02/13 1039) Resp:  [20] 20 (02/13 1039) BP: (159)/(73) 159/73 (02/13 1039) SpO2:  [100 %] 100 % (02/13 1039) Weight:  [175 lb (79.4 kg)] 175 lb (79.4 kg) (02/13 1039)  Labs:   Estimated body mass index is 25.84 kg/m as calculated from the following:   Height as of this encounter: 5\' 9"  (1.753 m).   Weight as of this  encounter: 175 lb (79.4 kg).   Imaging Review Plain radiographs demonstrate severe degenerative joint disease of the right knee(s). The overall alignment ismild varus. The bone quality appears to be good for age and reported activity level.  Assessment/Plan:  End stage arthritis, right knee   The patient history, physical examination, clinical judgment of the provider and imaging studies are consistent with end stage degenerative joint disease of the right knee(s) and total knee arthroplasty is deemed medically necessary. The treatment options including medical management, injection therapy arthroscopy and arthroplasty were discussed at length. The risks and benefits of total knee arthroplasty were presented and reviewed. The risks due to aseptic loosening, infection, stiffness, patella tracking problems, thromboembolic complications and other imponderables were discussed. The patient acknowledged the explanation, agreed to proceed with the plan and consent was signed. Patient is being admitted for inpatient treatment for surgery, pain control, PT, OT, prophylactic antibiotics, VTE prophylaxis, progressive ambulation and ADL's and discharge planning. The patient is planning to be discharged home with home health services

## 2016-07-22 NOTE — Progress Notes (Signed)
Orthopedic Tech Progress Note Patient Details:  Shane Jordan 10-02-1932 [id number]  CPM Right Knee CPM Right Knee: On Right Knee Flexion (Degrees): 90 Right Knee Extension (Degrees): 0 Additional Comments: Applied CPM to Right Knee/leg at 0-90.  Pt tolerted well.   Kristopher Oppenheim 07/22/2016, 2:41 PM

## 2016-07-22 NOTE — Anesthesia Procedure Notes (Signed)
Procedure Name: MAC Date/Time: 07/22/2016 12:17 PM Performed by: Carney Living Pre-anesthesia Checklist: Patient identified, Emergency Drugs available, Suction available, Patient being monitored and Timeout performed Patient Re-evaluated:Patient Re-evaluated prior to inductionOxygen Delivery Method: Nasal cannula

## 2016-07-22 NOTE — Anesthesia Procedure Notes (Signed)
Spinal  Patient location during procedure: OR Start time: 07/22/2016 12:12 PM End time: 07/22/2016 12:28 PM Staffing Anesthesiologist: Rica Koyanagi Performed: anesthesiologist  Preanesthetic Checklist Completed: patient identified, surgical consent, pre-op evaluation, timeout performed, IV checked, risks and benefits discussed and monitors and equipment checked Spinal Block Patient position: sitting Prep: Betadine Patient monitoring: heart rate, cardiac monitor, continuous pulse ox and blood pressure Approach: midline Location: L3-4 Injection technique: single-shot Needle Needle type: Pencan  Needle gauge: 24 G Needle length: 9 cm Needle insertion depth: 4 cm Assessment Sensory level: T8

## 2016-07-22 NOTE — Anesthesia Preprocedure Evaluation (Addendum)
Anesthesia Evaluation  Patient identified by MRN, date of birth, ID band Patient awake    Reviewed: Allergy & Precautions, NPO status , Patient's Chart, lab work & pertinent test results  History of Anesthesia Complications Negative for: history of anesthetic complications  Airway Mallampati: I  TM Distance: >3 FB Neck ROM: Full    Dental  (+) Edentulous Upper, Dental Advisory Given   Pulmonary former smoker,    breath sounds clear to auscultation       Cardiovascular hypertension, Pt. on medications  Rhythm:Regular Rate:Normal     Neuro/Psych    GI/Hepatic GERD  Medicated and Controlled,  Endo/Other  Hypothyroidism   Renal/GU      Musculoskeletal  (+) Arthritis , Osteoarthritis,    Abdominal   Peds  Hematology   Anesthesia Other Findings   Reproductive/Obstetrics                           Anesthesia Physical Anesthesia Plan  ASA: II  Anesthesia Plan: Spinal   Post-op Pain Management:  Regional for Post-op pain   Induction: Intravenous  Airway Management Planned: Natural Airway  Additional Equipment:   Intra-op Plan:   Post-operative Plan: Extubation in OR  Informed Consent: I have reviewed the patients History and Physical, chart, labs and discussed the procedure including the risks, benefits and alternatives for the proposed anesthesia with the patient or authorized representative who has indicated his/her understanding and acceptance.   Dental advisory given  Plan Discussed with: CRNA, Anesthesiologist and Surgeon  Anesthesia Plan Comments:        Anesthesia Quick Evaluation

## 2016-07-22 NOTE — Anesthesia Procedure Notes (Addendum)
Anesthesia Regional Block:  Adductor canal block  Pre-Anesthetic Checklist: ,, timeout performed, Correct Patient, Correct Site, Correct Laterality, Correct Procedure, Correct Position, site marked, Risks and benefits discussed,  Surgical consent,  Pre-op evaluation,  At surgeon's request and post-op pain management  Laterality: Right and Lower  Prep: chloraprep       Needles:   Needle Type: Echogenic Stimulator Needle     Needle Length: 9cm 9 cm Needle Gauge: 21 and 21 G  Needle insertion depth: 4 cm   Additional Needles:  Procedures: ultrasound guided (picture in chart) Adductor canal block Narrative:  Start time: 07/22/2016 11:35 AM End time: 07/22/2016 11:50 AM Injection made incrementally with aspirations every 5 mL.  Performed by: Personally  Anesthesiologist: Cyan Clippinger

## 2016-07-23 ENCOUNTER — Encounter (HOSPITAL_COMMUNITY): Payer: Self-pay | Admitting: Orthopaedic Surgery

## 2016-07-23 LAB — CBC
HCT: 35.6 % — ABNORMAL LOW (ref 39.0–52.0)
HEMOGLOBIN: 12 g/dL — AB (ref 13.0–17.0)
MCH: 31.5 pg (ref 26.0–34.0)
MCHC: 33.7 g/dL (ref 30.0–36.0)
MCV: 93.4 fL (ref 78.0–100.0)
Platelets: 220 10*3/uL (ref 150–400)
RBC: 3.81 MIL/uL — AB (ref 4.22–5.81)
RDW: 13.9 % (ref 11.5–15.5)
WBC: 8.8 10*3/uL (ref 4.0–10.5)

## 2016-07-23 LAB — BASIC METABOLIC PANEL
Anion gap: 13 (ref 5–15)
BUN: 11 mg/dL (ref 6–20)
CHLORIDE: 97 mmol/L — AB (ref 101–111)
CO2: 25 mmol/L (ref 22–32)
CREATININE: 0.9 mg/dL (ref 0.61–1.24)
Calcium: 8.7 mg/dL — ABNORMAL LOW (ref 8.9–10.3)
Glucose, Bld: 151 mg/dL — ABNORMAL HIGH (ref 65–99)
POTASSIUM: 4.2 mmol/L (ref 3.5–5.1)
SODIUM: 135 mmol/L (ref 135–145)

## 2016-07-23 NOTE — Anesthesia Postprocedure Evaluation (Addendum)
Anesthesia Post Note  Patient: JONNIE PUNTER  Procedure(s) Performed: Procedure(s) (LRB): RIGHT TOTAL KNEE ARTHROPLASTY (Right)  Patient location during evaluation: PACU Anesthesia Type: Spinal Level of consciousness: oriented and awake and alert Pain management: pain level controlled Vital Signs Assessment: post-procedure vital signs reviewed and stable Respiratory status: spontaneous breathing, respiratory function stable and patient connected to nasal cannula oxygen Cardiovascular status: blood pressure returned to baseline and stable Postop Assessment: no headache and no backache Anesthetic complications: no       Last Vitals:  Vitals:   07/22/16 2159 07/23/16 0343  BP: (!) 144/66 (!) 136/58  Pulse: 73 (!) 56  Resp: 16 16  Temp: 36.8 C 36.6 C    Last Pain:  Vitals:   07/23/16 0343  TempSrc: Oral  PainSc:                  Yer Castello,JAMES TERRILL

## 2016-07-23 NOTE — Op Note (Signed)
NAME:  Shane Jordan, Shane Jordan                    ACCOUNT NO.:  MEDICAL RECORD NO.:  [id number]  LOCATION:                                 FACILITY:  PHYSICIAN:  Shane Jordan, M.D.DATE OF BIRTH:  August 15, 1932  DATE OF PROCEDURE:  07/22/2016 DATE OF DISCHARGE:                              OPERATIVE REPORT   PREOPERATIVE DIAGNOSES:  Osteoarthritis and degenerative joint disease of right knee.  POSTOPERATIVE DIAGNOSES:  Osteoarthritis and degenerative joint disease of right knee.  PROCEDURE:  Right total knee arthroplasty.  IMPLANTS:  Stryker Triathlon knee with size 4 femur, size 4 tibial tray, 9 mm fix-bearing polyethylene insert, size 29 patellar button.  SURGEON:  Shane Jordan, M.D.  ASSISTANT:  Shane Emery, PA-C.  ANESTHESIA: 1. Right lower extremity regional adductor canal block. 2. Spinal.  ANTIBIOTICS:  2 g of IV Ancef.  BLOOD LOSS:  100 mL.  TOURNIQUET TIME:  Less than 1 hour.  COMPLICATIONS:  None.  INDICATIONS:  [name] is an 83 year old gentleman well known to me. He has debilitating arthritis involving his right knee.  He has tried and failed all forms of conservative treatment.  His pain is daily and it is detrimentally affected his activities of daily living, his quality of life and his mobility.  At this point, he does wish to proceed with a total knee arthroplasty.  He understands the risks of acute blood loss anemia, nerve and vessel injury, fracture, infection and DVT.  He understands our goals are decreased pain, improved mobility and overall improved quality of life.  PROCEDURE DESCRIPTION:  After informed consent was obtained, appropriate right knee was marked.  Anesthesia obtained an adductor canal block.  He was then brought to the operating room and setup on his operating table. Spinal anesthesia was obtained.  He was then laid supine on the operating table.  A nonsterile tourniquet was placed around his upper right thigh and  a Foley catheter was placed in his bladder as well.  His right operative leg was then prepped and draped from the thigh down to the toes with DuraPrep and sterile drapes.  Time-out was called and he was identified as correct patient and correct right knee.  We then used an Esmarch to wrap out the leg and the tourniquet was inflated to 300 mm of pressure.  We then made a direct midline incision over the patella and carried this proximally and distally.  We dissected down the knee joint and carried out a medial parapatellar arthrotomy.  We found a large joint effusion and significant periarticular osteophytes and worn cartilage throughout the knee.  With the knee in a flexed position, we removed remnants of the ACL, PCL, medial and lateral meniscus.  We put our tibia cutting guide based off extramedullary referencing taking 2 mm of the low side and 9 mm of the high side correcting for varus and valgus and neutral slope.  We made the tibial cut without difficulty. We then used an intramedullary guide through the intercondylar notch for the femur, setting our distal femoral cut at 8-mm, distal femoral cut setting a rotation at 5 degrees, externally rotated.  This was for right  knee.  We made the distal femoral cut without difficulty and then cleaned more debris from the knee.  We brought the knee back down to full extension with a 9-mm extension block, had achieved full extension. We went back to the femur and referenced our femoral rotation off the epicondylar axis and Whitesides line, setting our femoral sizing guide based off the epicondylar axis mainly.  We chose a size 4 femur.  We then put a 4-in-1 cutting block for a size 4 femur.  We made our anterior and posterior cuts followed by our chamfer cuts.  We then went back to the tibia and chose a size 4 tibia and made our keel punch and cut off referencing the tibial tubercle on the femur.  Within the trial 4 tibia and the 4 femur in  place, we trialed a 9-mm fix-bearing polyethylene insert and we were pleased with the range of motion and stability.  We then made our patellar cut and drilling 3 holes for a size 29 patellar button.  With all trial instruments in place, we put him through range of motion and we were pleased with stability and range of motion.  We then removed all trial instrumentation.  We irrigated the knee with normal saline solution using pulsatile lavage.  We were then able to mix our cement and cemented the real Stryker Triathlon tibial tray, size 4 followed by the real size 4 femur.  We removed cement debris from the knee and placed a 9-mm fix-bearing polyethylene insert. We then cemented our patellar button.  Once the cement had hardened, we removed additional cement debris from the knee.  We irrigated the knee with normal saline solution using pulsatile lavage.  We then let the tourniquet down and hemostasis was obtained with electrocautery.  We closed the arthrotomy then with interrupted #1 Vicryl suture followed by 0 Vicryl in the deep tissue, 2-0 Vicryl in the subcutaneous tissue, 4-0 Monocryl for subcuticular stitch and Steri-Strips on the skin.  Well- padded sterile dressing was applied and he was taken to the recovery room in stable condition.  All final counts were correct.  There were no complications noted.  Of note, Shane Emery, PA-C assisted in the entire case.  His assistance was crucial for facilitating all aspects of this case.     Shane Jordan, M.D.     CYB/MEDQ  D:  07/22/2016  T:  07/22/2016  Job:  [id number]

## 2016-07-23 NOTE — Progress Notes (Signed)
Subjective: 1 Day Post-Op Procedure(s) (LRB): RIGHT TOTAL KNEE ARTHROPLASTY (Right) Patient reports pain as moderate.    Objective: Vital signs in last 24 hours: Temp:  [97 F (36.1 C)-98.2 F (36.8 C)] 97.9 F (36.6 C) (02/14 0343) Pulse Rate:  [51-73] 56 (02/14 0343) Resp:  [11-20] 16 (02/14 0343) BP: (108-159)/(58-73) 136/58 (02/14 0343) SpO2:  [97 %-100 %] 100 % (02/14 0343) Weight:  [175 lb (79.4 kg)] 175 lb (79.4 kg) (02/13 1039)  Intake/Output from previous day: 02/13 0701 - 02/14 0700 In: 240 [P.O.:240] Out: 1775 [Urine:1675; Blood:100] Intake/Output this shift: No intake/output data recorded.   Recent Labs  07/23/16 0349  HGB 12.0*    Recent Labs  07/23/16 0349  WBC 8.8  RBC 3.81*  HCT 35.6*  PLT 220    Recent Labs  07/23/16 0349  NA 135  K 4.2  CL 97*  CO2 25  BUN 11  CREATININE 0.90  GLUCOSE 151*  CALCIUM 8.7*   No results for input(s): LABPT, INR in the last 72 hours.  Sensation intact distally Intact pulses distally Dorsiflexion/Plantar flexion intact Incision: dressing C/D/I Compartment soft  Assessment/Plan: 1 Day Post-Op Procedure(s) (LRB): RIGHT TOTAL KNEE ARTHROPLASTY (Right) Up with therapy  Mcarthur Rossetti 07/23/2016, 7:05 AM

## 2016-07-23 NOTE — Progress Notes (Signed)
Physical Therapy Treatment Patient Details Name: Shane Jordan MRN: [id number] DOB: 12-02-1932 Today's Date: 07/23/2016    History of Present Illness 83 y.o. male admitted to [facility] on 07/22/16 for elective R TKA.  Pt with significant PMHx of HTN    PT Comments    Pt more sore and fatigued today compared to yesterday evening.  He needed more physical assist for safety and balance during gait.  He did progress further down the hallway and preformed more of his HEP with assist, so he is progressing, he just doesn't feel as good as he did last night.  PT will continue to follow acutely.   Follow Up Recommendations  Home health PT;Supervision for mobility/OOB     Equipment Recommendations  None recommended by PT    Recommendations for Other Services   NA     Precautions / Restrictions Precautions Precautions: Knee Precaution Booklet Issued: Yes (comment) Precaution Comments: knee exercise handout given and no pillow reviewed Restrictions RLE Weight Bearing: Weight bearing as tolerated    Mobility  Bed Mobility Overal bed mobility: Modified Independent             General bed mobility comments: used stronger leg to loop surgical leg to bring it back into bed with him.   Transfers Overall transfer level: Needs assistance Equipment used: Rolling walker (2 wheeled) Transfers: Sit to/from Stand Sit to Stand: Min assist         General transfer comment: Min assist to support trunk during transitions to stand.  Verbal cues for safe hand placement.   Ambulation/Gait Ambulation/Gait assistance: Min assist Ambulation Distance (Feet): 100 Feet Assistive device: Rolling walker (2 wheeled) Gait Pattern/deviations: Step-through pattern;Antalgic Gait velocity: decreased   General Gait Details: Pt with flexed knee gait pattern, but able to compensate with use of hands.  No buckling noted, verbal cues for safe proximity to RW.           Balance Overall balance assessment:  Needs assistance Sitting-balance support: Feet supported;No upper extremity supported Sitting balance-Leahy Scale: Good     Standing balance support: Bilateral upper extremity supported;During functional activity Standing balance-Leahy Scale: Poor Standing balance comment: Nees physical assist when standing, especially if not holding RW.                     Cognition Arousal/Alertness: Awake/alert Behavior During Therapy: WFL for tasks assessed/performed Overall Cognitive Status: Within Functional Limits for tasks assessed                      Exercises Total Joint Exercises Short Arc QuadSinclair Ship;Right;10 reps Hip ABduction/ADduction: AAROM;Right;10 reps Straight Leg Raises: AAROM;Right;10 reps Long Arc Quad: AROM;Right;10 reps Goniometric ROM: 20-75        Pertinent Vitals/Pain Pain Assessment: 0-10 Pain Score: 6  Pain Location: right knee Pain Descriptors / Indicators: Discomfort Pain Intervention(s): Limited activity within patient's tolerance;Monitored during session;Repositioned;Ice applied           PT Goals (current goals can now be found in the care plan section) Acute Rehab PT Goals Patient Stated Goal: to get back to golf Progress towards PT goals: Progressing toward goals    Frequency    7X/week      PT Plan Current plan remains appropriate       End of Session Equipment Utilized During Treatment: Gait belt Activity Tolerance: Patient limited by pain Patient left: in bed;in CPM;with call bell/phone within reach;with family/visitor present     Time:  [id number] PT Time Calculation (min) (ACUTE ONLY): 27 min  Charges:  $Gait Training: 8-22 mins $Therapeutic Exercise: 8-22 mins                      Gilbert Narain B. Livie Vanderhoof, PT, DPT [phone]   07/23/2016, 1:19 PM

## 2016-07-23 NOTE — Evaluation (Signed)
Occupational Therapy Evaluation Patient Details Name: Shane Jordan MRN: [id number] DOB: 1932/07/18 Today's Date: 07/23/2016    History of Present Illness 83 y.o. male admitted to [facility] on 07/22/16 for elective R TKA.  Pt with significant PMHx of HTN   Clinical Impression   Patient is s/p R TKA surgery resulting in functional limitations due to the deficits listed below (see OT problem list). PTA as independent with adls.  Patient will benefit from skilled OT acutely to increase independence and safety with ADLS to allow discharge [place].     Follow Up Recommendations  Home health OT    Equipment Recommendations  None recommended by OT    Recommendations for Other Services       Precautions / Restrictions Precautions Precautions: Knee Precaution Comments: KI education provided at this time due to inability to lift 10 times  Restrictions RLE Weight Bearing: Weight bearing as tolerated      Mobility Bed Mobility Overal bed mobility: Modified Independent                Transfers Overall transfer level: Needs assistance Equipment used: Rolling walker (2 wheeled) Transfers: Sit to/from Stand Sit to Stand: Min guard         General transfer comment: cues for hand placement and extending R LE    Balance Overall balance assessment: Needs assistance Sitting-balance support: Feet supported;No upper extremity supported Sitting balance-Leahy Scale: Good     Standing balance support: Bilateral upper extremity supported;During functional activity Standing balance-Leahy Scale: Poor Standing balance comment: LOB at sink and required 1 UE on sink surface                            ADL Overall ADL's : Needs assistance/impaired Eating/Feeding: Independent   Grooming: Wash/dry hands;Wash/dry face;Oral care;Min guard;Standing Grooming Details (indicate cue type and reason): pt with posterior LOB at sink with MOD (A) To correct Upper Body Bathing:  Supervision/ safety;Sitting   Lower Body Bathing: Minimal assistance;Sit to/from stand Lower Body Bathing Details (indicate cue type and reason): steady assistance  Upper Body Dressing : Supervision/safety   Lower Body Dressing: Minimal assistance;Sit to/from stand Lower Body Dressing Details (indicate cue type and reason): pt is able to reach feet and educated on sequence for dressing R LE first  Toilet Transfer: Minimal assistance;BSC       Tub/ Shower Transfer: Moderate assistance;3 in 1;Rolling walker Tub/Shower Transfer Details (indicate cue type and reason): educated to positioning of the 3n1. backing up to the 3n1 then lifting bil LE into the tub. pt demonstrates LOB at sink so pt needs to sit first Functional mobility during ADLs: Min guard;Rolling walker General ADL Comments: pt able to reach Bil and educated on don KI and ice pack     Vision     Perception     Praxis      Pertinent Vitals/Pain Pain Assessment: 0-10 Pain Score: 3  Pain Location: right knee Pain Descriptors / Indicators: Discomfort Pain Intervention(s): Monitored during session;Premedicated before session;Repositioned;Ice applied     Hand Dominance Right   Extremity/Trunk Assessment Upper Extremity Assessment Upper Extremity Assessment: Overall WFL for tasks assessed   Lower Extremity Assessment Lower Extremity Assessment: Defer to PT evaluation   Cervical / Trunk Assessment Cervical / Trunk Assessment: Normal   Communication Communication Communication: No difficulties   Cognition Arousal/Alertness: Awake/alert Behavior During Therapy: WFL for tasks assessed/performed Overall Cognitive Status: Within Functional Limits for tasks assessed  General Comments       Exercises       Shoulder Instructions      Home Living Family/patient expects to be discharged to:: Private residence Living Arrangements: Spouse/significant other Available Help at Discharge:  Family;Available 24 hours/day Type of Home: House Home Access: Stairs to enter [employer] of Steps: 3 Entrance Stairs-Rails: None Home Layout: Multi-level;Full bath on main level;Able to live on main level with bedroom/bathroom     Bathroom Shower/Tub: Teacher, early years/pre: Standard     Home Equipment: Environmental consultant - 2 wheels;Bedside commode;Grab bars - tub/shower;Hand held shower head;Cane - single point          Prior Functioning/Environment Level of Independence: Independent        Comments: pt likes to play golf and planting a garden        OT Problem List: Decreased strength;Decreased range of motion;Decreased activity tolerance;Impaired balance (sitting and/or standing);Decreased safety awareness;Decreased knowledge of precautions;Decreased knowledge of use of DME or AE;Pain   OT Treatment/Interventions: Self-care/ADL training;Therapeutic exercise;Energy conservation;DME and/or AE instruction;Therapeutic activities;Patient/family education;Balance training    OT Goals(Current goals can be found in the care plan section) Acute Rehab OT Goals Patient Stated Goal: to get back to golf OT Goal Formulation: With patient Time For Goal Achievement: 08/06/16 Potential to Achieve Goals: Good  OT Frequency: Min 2X/week   Barriers to D/C:            Co-evaluation              End of Session Equipment Utilized During Treatment: Gait belt;Rolling walker;Right knee immobilizer CPM Right Knee CPM Right Knee: Off Nurse Communication: Mobility status;Precautions;Weight bearing status  Activity Tolerance: Patient tolerated treatment well Patient left: in chair;with call bell/phone within reach;with family/visitor present   Time: [id number] OT Time Calculation (min): 36 min Charges:  OT General Charges $OT Visit: 1 Procedure OT Evaluation $OT Eval Moderate Complexity: 1 Procedure OT Treatments $Self Care/Home Management : 8-22 mins G-Codes:     Peri Maris Aug 15, 2016, 10:10 AM   Jeri Modena   OTR/L Pager: [phone] Office: [phone] .

## 2016-07-23 NOTE — Discharge Instructions (Addendum)
INSTRUCTIONS AFTER JOINT REPLACEMENT  ° °o Remove items at home which could result in a fall. This includes throw rugs or furniture in walking pathways °o ICE to the affected joint every three hours while awake for 30 minutes at a time, for at least the first 3-5 days, and then as needed for pain and swelling.  Continue to use ice for pain and swelling. You may notice swelling that will progress down to the foot and ankle.  This is normal after surgery.  Elevate your leg when you are not up walking on it.   °o Continue to use the breathing machine you got in the hospital (incentive spirometer) which will help keep your temperature down.  It is common for your temperature to cycle up and down following surgery, especially at night when you are not up moving around and exerting yourself.  The breathing machine keeps your lungs expanded and your temperature down. ° ° °DIET:  As you were doing prior to hospitalization, we recommend a well-balanced diet. ° °DRESSING / WOUND CARE / SHOWERING ° °Keep the surgical dressing until follow up.  The dressing is water proof, so you can shower without any extra covering.  IF THE DRESSING FALLS OFF or the wound gets wet inside, change the dressing with sterile gauze.  Please use good hand washing techniques before changing the dressing.  Do not use any lotions or creams on the incision until instructed by your surgeon.   ° °ACTIVITY ° °o Increase activity slowly as tolerated, but follow the weight bearing instructions below.   °o No driving for 6 weeks or until further direction given by your physician.  You cannot drive while taking narcotics.  °o No lifting or carrying greater than 10 lbs. until further directed by your surgeon. °o Avoid periods of inactivity such as sitting longer than an hour when not asleep. This helps prevent blood clots.  °o You may return to work once you are authorized by your doctor.  ° ° ° °WEIGHT BEARING  ° °Weight bearing as tolerated with assist  device (walker, cane, etc) as directed, use it as long as suggested by your surgeon or therapist, typically at least 4-6 weeks. ° ° °EXERCISES ° °Results after joint replacement surgery are often greatly improved when you follow the exercise, range of motion and muscle strengthening exercises prescribed by your doctor. Safety measures are also important to protect the joint from further injury. Any time any of these exercises cause you to have increased pain or swelling, decrease what you are doing until you are comfortable again and then slowly increase them. If you have problems or questions, call your caregiver or physical therapist for advice.  ° °Rehabilitation is important following a joint replacement. After just a few days of immobilization, the muscles of the leg can become weakened and shrink (atrophy).  These exercises are designed to build up the tone and strength of the thigh and leg muscles and to improve motion. Often times heat used for twenty to thirty minutes before working out will loosen up your tissues and help with improving the range of motion but do not use heat for the first two weeks following surgery (sometimes heat can increase post-operative swelling).  ° °These exercises can be done on a training (exercise) mat, on the floor, on a table or on a bed. Use whatever works the best and is most comfortable for you.    Use music or television while you are exercising so that   the exercises are a pleasant break in your day. This will make your life better with the exercises acting as a break in your routine that you can look forward to.   Perform all exercises about fifteen times, three times per day or as directed.  You should exercise both the operative leg and the other leg as well. ° °Exercises include: °  °• Quad Sets - Tighten up the muscle on the front of the thigh (Quad) and hold for 5-10 seconds.   °• Straight Leg Raises - With your knee straight (if you were given a brace, keep it on),  lift the leg to 60 degrees, hold for 3 seconds, and slowly lower the leg.  Perform this exercise against resistance later as your leg gets stronger.  °• Leg Slides: Lying on your back, slowly slide your foot toward your buttocks, bending your knee up off the floor (only go as far as is comfortable). Then slowly slide your foot back down until your leg is flat on the floor again.  °• Angel Wings: Lying on your back spread your legs to the side as far apart as you can without causing discomfort.  °• Hamstring Strength:  Lying on your back, push your heel against the floor with your leg straight by tightening up the muscles of your buttocks.  Repeat, but this time bend your knee to a comfortable angle, and push your heel against the floor.  You may put a pillow under the heel to make it more comfortable if necessary.  ° °A rehabilitation program following joint replacement surgery can speed recovery and prevent re-injury in the future due to weakened muscles. Contact your doctor or a physical therapist for more information on knee rehabilitation.  ° ° °CONSTIPATION ° °Constipation is defined medically as fewer than three stools per week and severe constipation as less than one stool per week.  Even if you have a regular bowel pattern at home, your normal regimen is likely to be disrupted due to multiple reasons following surgery.  Combination of anesthesia, postoperative narcotics, change in appetite and fluid intake all can affect your bowels.  ° °YOU MUST use at least one of the following options; they are listed in order of increasing strength to get the job done.  They are all available over the counter, and you may need to use some, POSSIBLY even all of these options:   ° °Drink plenty of fluids (prune juice may be helpful) and high fiber foods °Colace 100 mg by mouth twice a day  °Senokot for constipation as directed and as needed Dulcolax (bisacodyl), take with full glass of water  °Miralax (polyethylene glycol)  once or twice a day as needed. ° °If you have tried all these things and are unable to have a bowel movement in the first 3-4 days after surgery call either your surgeon or your primary doctor.   ° °If you experience loose stools or diarrhea, hold the medications until you stool forms back up.  If your symptoms do not get better within 1 week or if they get worse, check with your doctor.  If you experience "the worst abdominal pain ever" or develop nausea or vomiting, please contact the office immediately for further recommendations for treatment. ° ° °ITCHING:  If you experience itching with your medications, try taking only a single pain pill, or even half a pain pill at a time.  You can also use Benadryl over the counter for itching or also to   help with sleep.  ° °TED HOSE STOCKINGS:  Use stockings on both legs until for at least 2 weeks or as directed by physician office. They may be removed at night for sleeping. ° °MEDICATIONS:  See your medication summary on the “After Visit Summary” that nursing will review with you.  You may have some home medications which will be placed on hold until you complete the course of blood thinner medication.  It is important for you to complete the blood thinner medication as prescribed. ° °PRECAUTIONS:  If you experience chest pain or shortness of breath - call 911 immediately for transfer to the hospital emergency department.  ° °If you develop a fever greater that 101 F, purulent drainage from wound, increased redness or drainage from wound, foul odor from the wound/dressing, or calf pain - CONTACT YOUR SURGEON.   °                                                °FOLLOW-UP APPOINTMENTS:  If you do not already have a post-op appointment, please call the office for an appointment to be seen by your surgeon.  Guidelines for how soon to be seen are listed in your “After Visit Summary”, but are typically between 1-4 weeks after surgery. ° °OTHER INSTRUCTIONS:  ° °Knee  Replacement:  Do not place pillow under knee, focus on keeping the knee straight while resting. CPM instructions: 0-90 degrees, 2 hours in the morning, 2 hours in the afternoon, and 2 hours in the evening. Place foam block, curve side up under heel at all times except when in CPM or when walking.  DO NOT modify, tear, cut, or change the foam block in any way. ° °MAKE SURE YOU:  °• Understand these instructions.  °• Get help right away if you are not doing well or get worse.  ° ° °Thank you for letting us be a part of your medical care team.  It is a privilege we respect greatly.  We hope these instructions will help you stay on track for a fast and full recovery!  ° °Information on my medicine - XARELTO® (Rivaroxaban) ° ° °Why was Xarelto® prescribed for you? °Xarelto® was prescribed for you to reduce the risk of blood clots forming after orthopedic surgery. The medical term for these abnormal blood clots is venous thromboembolism (VTE). ° °What do you need to know about xarelto® ? °Take your Xarelto® ONCE DAILY at the same time every day. °You may take it either with or without food. ° °If you have difficulty swallowing the tablet whole, you may crush it and mix in applesauce just prior to taking your dose. ° °Take Xarelto® exactly as prescribed by your doctor and DO NOT stop taking Xarelto® without talking to the doctor who prescribed the medication.  Stopping without other VTE prevention medication to take the place of Xarelto® may increase your risk of developing a clot. ° °After discharge, you should have regular check-up appointments with your healthcare provider that is prescribing your Xarelto®.   ° °What do you do if you miss a dose? °If you miss a dose, take it as soon as you remember on the same day then continue your regularly scheduled once daily regimen the next day. Do not take two doses of Xarelto® on the same day.  ° °Important Safety Information °A possible   side effect of Xarelto® is bleeding. You  should call your healthcare provider right away if you experience any of the following: °? Bleeding from an injury or your nose that does not stop. °? Unusual colored urine (red or dark brown) or unusual colored stools (red or black). °? Unusual bruising for unknown reasons. °? A serious fall or if you hit your head (even if there is no bleeding). ° °Some medicines may interact with Xarelto® and might increase your risk of bleeding while on Xarelto®. To help avoid this, consult your healthcare provider or pharmacist prior to using any new prescription or non-prescription medications, including herbals, vitamins, non-steroidal anti-inflammatory drugs (NSAIDs) and supplements. ° °This website has more information on Xarelto®: www.xarelto.com. ° ° ° °

## 2016-07-23 NOTE — Progress Notes (Signed)
Physical Therapy Treatment Patient Details Name: Shane Jordan MRN: [id number] DOB: June 01, 1933 Today's Date: 07/23/2016    History of Present Illness 83 y.o. male admitted to [facility] on 07/22/16 for elective R TKA.  Pt with significant PMHx of HTN    PT Comments    Pt is POD #1 and this is his second session.  Pt was able to progress gait a bit further down the hallway and we have now reviewed all of the exercises in his HEP.  He will need to practice stairs in the AM.  Wife is very anxious about taking him home and has lots of questions.  He may need two sessions tomorrow just to get her more comfortable and further educated on handling him (might be a good idea to let her guard him as well).    Follow Up Recommendations  Home health PT;Supervision for mobility/OOB     Equipment Recommendations  None recommended by PT    Recommendations for Other Services   NA     Precautions / Restrictions Precautions Precautions: Knee Precaution Booklet Issued: Yes (comment) Precaution Comments: knee exercise handout given and no pillow reviewed Restrictions RLE Weight Bearing: Weight bearing as tolerated    Mobility  Bed Mobility Overal bed mobility: Modified Independent             General bed mobility comments: used stronger leg to loop surgical leg to bring it EOB  Transfers Overall transfer level: Needs assistance Equipment used: Rolling walker (2 wheeled) Transfers: Sit to/from Stand Sit to Stand: Min assist         General transfer comment: Min assist to support trunk during transitions to stand.  Verbal cues for safe hand placement.   Ambulation/Gait Ambulation/Gait assistance: Min assist Ambulation Distance (Feet): 120 Feet Assistive device: Rolling walker (2 wheeled) Gait Pattern/deviations: Step-through pattern;Antalgic Gait velocity: decreased   General Gait Details: Pt with flexed knee gait pattern, but able to compensate with use of hands.  No buckling noted,  verbal cues for safe proximity to RW.  Gait pattern improved with cues and increased distance.          Balance Overall balance assessment: Needs assistance Sitting-balance support: Feet supported;No upper extremity supported Sitting balance-Leahy Scale: Good     Standing balance support: Bilateral upper extremity supported;During functional activity Standing balance-Leahy Scale: Poor Standing balance comment: Nees physical assist when standing, especially if not holding RW.                     Cognition Arousal/Alertness: Awake/alert Behavior During Therapy: WFL for tasks assessed/performed Overall Cognitive Status: Within Functional Limits for tasks assessed                      Exercises Total Joint Exercises Quad Sets: AROM;10 reps;Right Towel Squeeze: AROM;Both;10 reps Heel Slides: AAROM;Right;10 reps Knee Flexion: AROM;AAROM;Seated;10 reps        Pertinent Vitals/Pain Pain Assessment: 0-10 Pain Score: 3  Pain Location: right knee Pain Descriptors / Indicators: Discomfort Pain Intervention(s): Limited activity within patient's tolerance;Monitored during session;Repositioned;Patient requesting pain meds-RN notified;RN gave pain meds during session;Ice applied           PT Goals (current goals can now be found in the care plan section) Acute Rehab PT Goals Patient Stated Goal: to get back to golf Progress towards PT goals: Progressing toward goals    Frequency    7X/week      PT Plan Current plan remains appropriate  End of Session Equipment Utilized During Treatment: Gait belt Activity Tolerance: Patient limited by pain Patient left: in bed;in CPM;with call bell/phone within reach;with family/visitor present     Time: [id number] PT Time Calculation (min) (ACUTE ONLY): 41 min  Charges:  $Gait Training: 8-22 mins $Therapeutic Exercise: 8-22 mins $Therapeutic Activity: 8-22 mins                      Alizea Pell B. [place],  [place], DPT [phone]   07/23/2016, 4:17 PM

## 2016-07-24 MED ORDER — METHOCARBAMOL 500 MG PO TABS: 500.0000 mg | ORAL_TABLET | Freq: Four times a day (QID) | ORAL | 0 refills | Status: AC | PRN

## 2016-07-24 MED ORDER — OXYCODONE-ACETAMINOPHEN 5-325 MG PO TABS: 1.0000 | ORAL_TABLET | ORAL | 0 refills | Status: AC | PRN

## 2016-07-24 MED ORDER — RIVAROXABAN 10 MG PO TABS: 10.0000 mg | ORAL_TABLET | Freq: Every day | ORAL | 0 refills | Status: AC

## 2016-07-24 NOTE — Care Management Note (Signed)
Case Management Note  Patient Details  Name: ZI WATERMAN MRN: [id number] Date of Birth: 1932-10-09  Subjective/Objective:  83 yr old male s/p right total kee arthroplasty.                 Action/Plan: Case manager spoke with patient and wife concerning home health and DME needs. Patient was preoperatively setup with [facility], no changes. He has rolling walker and 3in1 at home.    Expected Discharge Date:  07/24/16               Expected Discharge Plan:  [place]  In-House Referral:  NA  Discharge planning Services  CM Consult  Post Acute Care Choice:  Home Health Choice offered to:  Patient, Spouse  DME Arranged:  N/A (patient has rolling walker and 3in1) DME Agency:     HH Arranged:  PT HH Agency:  [facility] (formerly [employer])  Status of Service:  Completed, signed off  If discussed at [employer], dates discussed:    Additional Comments:  Ninfa Meeker, RN 07/24/2016, 11:07 AM

## 2016-07-24 NOTE — Discharge Summary (Signed)
Patient ID: Shane Jordan MRN: [id number] DOB/AGE: 81-Jun-1934 81 y.o.  Admit date: 07/22/2016 Discharge date: 07/24/2016  Admission Diagnoses:  Principal Problem:   Unilateral primary osteoarthritis, right knee Active Problems:   Status post total right knee replacement   Discharge Diagnoses:  Same  Past Medical History:  Diagnosis Date  . Arthritis   . GERD (gastroesophageal reflux disease)   . Hypertension   . Hypothyroidism   . Malignant melanoma of abdomen ([place]) 1990s   "went to [facility]; took shots & was tested ~ q3 months; after 2 1/2 years they said to go home and do well"    Surgeries: Procedure(s): RIGHT TOTAL KNEE ARTHROPLASTY on 07/22/2016   Consultants:   Discharged Condition: Improved  Hospital Course: KEVONTAE SILVERIA is an 83 y.o. male who was admitted 07/22/2016 for operative treatment ofUnilateral primary osteoarthritis, right knee. Patient has severe unremitting pain that affects sleep, daily activities, and work/hobbies. After pre-op clearance the patient was taken to the operating room on 07/22/2016 and underwent  Procedure(s): RIGHT TOTAL KNEE ARTHROPLASTY.    Patient was given perioperative antibiotics: Anti-infectives    Start     Dose/Rate Route Frequency Ordered Stop   07/22/16 1800  ceFAZolin (ANCEF) IVPB 1 g/50 mL premix     1 g 100 mL/hr over 30 Minutes Intravenous Every 6 hours 07/22/16 1537 07/22/16 2340   07/22/16 1100  ceFAZolin (ANCEF) IVPB 2g/100 mL premix     2 g 200 mL/hr over 30 Minutes Intravenous To Surgery 07/22/16 1048 07/22/16 1225   07/22/16 1050  ceFAZolin (ANCEF) 2-4 GM/100ML-% IVPB    Comments:  Starleen Arms   : cabinet override      07/22/16 1050 07/22/16 1225       Patient was given sequential compression devices, early ambulation, and chemoprophylaxis to prevent DVT.  Patient benefited maximally from hospital stay and there were no complications.    Recent vital signs: Patient Vitals for the past 24  hrs:  BP Temp Temp src Pulse Resp SpO2  07/24/16 0633 (!) 141/57 98.6 F (37 C) Oral 66 - 96 %  07/23/16 2230 (!) 131/57 99.7 F (37.6 C) Oral 74 18 95 %  07/23/16 1343 139/79 98.4 F (36.9 C) Oral 75 19 96 %  07/23/16 1056 123/65 98.2 F (36.8 C) Oral 75 18 96 %     Recent laboratory studies:  Recent Labs  07/23/16 0349  WBC 8.8  HGB 12.0*  HCT 35.6*  PLT 220  NA 135  K 4.2  CL 97*  CO2 25  BUN 11  CREATININE 0.90  GLUCOSE 151*  CALCIUM 8.7*     Discharge Medications:   Allergies as of 07/24/2016      Reactions   Hydromorphone Anxiety   Hydrochlorothiazide Hives   Statins Hives   Sulfa Antibiotics Other (See Comments)   Tylenol [acetaminophen] Other (See Comments)   Patient reports feeling "groggy"   Welchol [colesevelam Hcl] Other (See Comments)      Medication List    TAKE these medications   amLODipine 10 MG tablet Commonly known as:  NORVASC Take 10 mg by mouth daily.   ezetimibe 10 MG tablet Commonly known as:  ZETIA Take 10 mg by mouth daily.   levothyroxine 50 MCG tablet Commonly known as:  SYNTHROID, LEVOTHROID Take 50 mcg by mouth daily before breakfast.   methocarbamol 500 MG tablet Commonly known as:  ROBAXIN Take 1 tablet (500 mg total) by mouth every 6 (six) hours  as needed for muscle spasms.   olmesartan 40 MG tablet Commonly known as:  BENICAR Take 1 tablet by mouth daily.   omeprazole 40 MG capsule Commonly known as:  PRILOSEC Take 40 mg by mouth as needed.   oxyCODONE-acetaminophen 5-325 MG tablet Commonly known as:  ROXICET Take 1-2 tablets by mouth every 4 (four) hours as needed.   rivaroxaban 10 MG Tabs tablet Commonly known as:  XARELTO Take 1 tablet (10 mg total) by mouth daily with breakfast.            Durable Medical Equipment        Start     Ordered   07/22/16 1538  DME Walker rolling  Once    Question:  Patient needs a walker to treat with the following condition  Answer:  Status post total right knee  replacement   07/22/16 1537   07/22/16 1538  DME 3 n 1  Once     07/22/16 1537      Diagnostic Studies: Dg Knee Right Port  Result Date: 07/22/2016 CLINICAL DATA:  83 year old male post right knee replacement. Initial encounter. EXAM: PORTABLE RIGHT KNEE - 1-2 VIEW COMPARISON:  03/24/2016. FINDINGS: Post total right knee replacement appearing in satisfactory position without complication noted. IMPRESSION: Post total right knee replacement without complication noted. Electronically Signed   By: Genia Del M.D.   On: 07/22/2016 15:30    Disposition: to home  Discharge Instructions    Discharge patient    Complete by:  As directed    Can discharge to home in the afternoon today.   Discharge disposition:  01-Home or Self Care   Discharge patient date:  07/24/2016      Follow-up Information    Mcarthur Rossetti, MD Follow up in 2 week(s).   Specialty:  Orthopedic Surgery Contact information: [place] [address] [phone]            Signed: Mcarthur Rossetti 07/24/2016, 7:30 AM

## 2016-07-24 NOTE — Progress Notes (Signed)
Subjective: 2 Days Post-Op Procedure(s) (LRB): RIGHT TOTAL KNEE ARTHROPLASTY (Right) Patient reports pain as moderate.    Objective: Vital signs in last 24 hours: Temp:  [98.2 F (36.8 C)-99.7 F (37.6 C)] 98.6 F (37 C) (02/15 [id number]) Pulse Rate:  [66-75] 66 (02/15 0633) Resp:  [18-19] 18 (02/14 2230) BP: (123-141)/(57-79) 141/57 (02/15 0633) SpO2:  [95 %-96 %] 96 % (02/15 0633)  Intake/Output from previous day: 02/14 0701 - 02/15 0700 In: 820 [P.O.:720] Out: 250 [Urine:250] Intake/Output this shift: No intake/output data recorded.   Recent Labs  07/23/16 0349  HGB 12.0*    Recent Labs  07/23/16 0349  WBC 8.8  RBC 3.81*  HCT 35.6*  PLT 220    Recent Labs  07/23/16 0349  NA 135  K 4.2  CL 97*  CO2 25  BUN 11  CREATININE 0.90  GLUCOSE 151*  CALCIUM 8.7*   No results for input(s): LABPT, INR in the last 72 hours.  Sensation intact distally Intact pulses distally Dorsiflexion/Plantar flexion intact Incision: dressing C/D/I No cellulitis present Compartment soft  Assessment/Plan: 2 Days Post-Op Procedure(s) (LRB): RIGHT TOTAL KNEE ARTHROPLASTY (Right) Up with therapy Discharge home with home health today.  Mcarthur Rossetti 07/24/2016, 7:27 AM

## 2016-07-24 NOTE — Progress Notes (Signed)
Patient discharged to home with belongings, IVs and tele removed. Prescriptions given. Teach back performed, all questions answered. Patient stable at time of discharge.

## 2016-07-24 NOTE — Progress Notes (Signed)
Occupational Therapy Treatment Patient Details Name: Shane Jordan MRN: [id number] DOB: [birth year] Today's Date: 07/24/2016    History of present illness 83 y.o. male admitted to [facility] on 07/22/16 for elective R TKA.  Pt with significant PMHx of HTN   OT comments  Pt making excellent progress towards OT goals this session. Pt able to take a shower, and perform dressing and sink level grooming. Wife of patient involved and received caregiver education. Pt at adequate level for dc from OT perspective.   Follow Up Recommendations  Home health OT    Equipment Recommendations  None recommended by OT    Recommendations for Other Services      Precautions / Restrictions Precautions Precautions: Knee Precaution Booklet Issued: Yes (comment) Precaution Comments: reviewed no pillow under knee Restrictions Weight Bearing Restrictions: Yes RLE Weight Bearing: Weight bearing as tolerated       Mobility Bed Mobility               General bed mobility comments: Pt OOB in recliner when OT entered room  Transfers Overall transfer level: Needs assistance Equipment used: Rolling walker (2 wheeled) Transfers: Sit to/from Stand Sit to Stand: Min guard         General transfer comment: no physical assist needed, good hand placement    Balance Overall balance assessment: Needs assistance Sitting-balance support: Feet supported;No upper extremity supported Sitting balance-Leahy Scale: Good Sitting balance - Comments: EOB for dressing   Standing balance support: Single extremity supported;During functional activity Standing balance-Leahy Scale: Fair Standing balance comment: no posterior lean this session                   ADL       Grooming: Oral care;Min guard;Standing (no posterior LOB this session)   Upper Body Bathing: Set up;Sitting   Lower Body Bathing: Set up;Sit to/from stand Lower Body Bathing Details (indicate cue type and reason): in shower using 3 in 1  as shower chair Upper Body Dressing : Modified independent;Sitting   Lower Body Dressing: Moderate assistance;With caregiver independent assisting;Sit to/from stand Lower Body Dressing Details (indicate cue type and reason): Pt with good memory to dress RLE first, donned underwear, pants, and shoes using long handle shoe horn Toilet Transfer: Min guard;BSC;Ambulation;RW       Tub/ Shower Transfer: Walk-in shower;Min guard;Cueing for sequencing;Ambulation;3 in 1;Rolling walker Tub/Shower Transfer Details (indicate cue type and reason): reinforced education for tub at home, but Pt elected to take actual shower in room, no problems Functional mobility during ADLs: Min guard;Rolling walker        Vision                     Perception     Praxis      Cognition   Behavior During Therapy: WFL for tasks assessed/performed Overall Cognitive Status: Within Functional Limits for tasks assessed                       Extremity/Trunk Assessment               Exercises     Shoulder Instructions       General Comments      Pertinent Vitals/ Pain       Pain Assessment: 0-10 Pain Score: 2  Pain Location: right knee Pain Descriptors / Indicators: Discomfort Pain Intervention(s): Monitored during session;Repositioned;Ice applied  Home Living  Prior Functioning/Environment              Frequency  Min 2X/week        Progress Toward Goals  OT Goals(current goals can now be found in the care plan section)  Progress towards OT goals: Progressing toward goals  Acute Rehab OT Goals Patient Stated Goal: to get back to golf OT Goal Formulation: With patient Time For Goal Achievement: 08/06/16 Potential to Achieve Goals: Good  Plan Discharge plan remains appropriate    Co-evaluation                 End of Session Equipment Utilized During Treatment: Gait belt;Rolling walker    Activity Tolerance Patient tolerated treatment well   Patient Left in chair;with call bell/phone within reach;with family/visitor present   Nurse Communication Mobility status;Precautions;Weight bearing status (Pt requesting bandage change on IV site)        Time: [id number] OT Time Calculation (min): 55 min  Charges: OT General Charges $OT Visit: 1 Procedure OT Treatments $Self Care/Home Management : 23-37 mins $Therapeutic Activity: 23-37 mins  Jaci Carrel 07/24/2016, 10:31 AM Hulda Humphrey OTR/L [phone]

## 2016-07-24 NOTE — Progress Notes (Signed)
Physical Therapy Treatment Patient Details Name: Shane Jordan MRN: [id number] DOB: [birth year] Today's Date: 07/24/2016    History of Present Illness 83 y.o. male admitted to [facility] on 07/22/16 for elective R TKA.  Pt with significant PMHx of HTN    PT Comments    POD # 2  Spouse present during session and had many questions and some anxiety about how to care for her husband at home.  Had spouse "hands on" assist pt with all mobility including up/down one step.  Also performed and instructed on TKR TE's following handout including proper tech, freq and use of ICE.  Instructed on positioning of R LE in extension for sleeping and long recliner sitting.  Addressed all spouse questions.   Follow Up Recommendations  Home health PT;Supervision for mobility/OOB     Equipment Recommendations  None recommended by PT    Recommendations for Other Services       Precautions / Restrictions Precautions Precautions: Knee Precaution Booklet Issued: Yes (comment) Precaution Comments: reviewed no pillow under knee Restrictions Weight Bearing Restrictions: No RLE Weight Bearing: Weight bearing as tolerated    Mobility  Bed Mobility               General bed mobility comments: Pt OOB in recliner when PT entered room  Transfers Overall transfer level: Needs assistance Equipment used: Rolling walker (2 wheeled) Transfers: Sit to/from Stand Sit to Stand: Supervision;Min guard         General transfer comment: no physical assist needed, good hand placement   a bit impulsive  Ambulation/Gait Ambulation/Gait assistance: Supervision;Min guard Ambulation Distance (Feet): 87 Feet Assistive device: Rolling walker (2 wheeled) Gait Pattern/deviations: Step-through pattern;Antalgic Gait velocity: decreased   General Gait Details: 25% VC's on safety with turns and safety with backward gait   Stairs Stairs: Yes   Stair Management: No rails;Step to pattern;Forwards;With walker Number  of Stairs: 1 General stair comments: 25% VC's on proper walker placement and safety.  Performed with spouse for "hands on" instruction.  Performed twice.   Wheelchair Mobility    Modified Rankin (Stroke Patients Only)       Balance Overall balance assessment: Needs assistance Sitting-balance support: Feet supported;No upper extremity supported Sitting balance-Leahy Scale: Good Sitting balance - Comments: EOB for dressing   Standing balance support: Single extremity supported;During functional activity Standing balance-Leahy Scale: Fair Standing balance comment: no posterior lean this session                    Cognition Arousal/Alertness: Awake/alert Behavior During Therapy: WFL for tasks assessed/performed Overall Cognitive Status: Within Functional Limits for tasks assessed                      Exercises      General Comments General comments (skin integrity, edema, etc.): Pt's wife present and willing/able participant during session      Pertinent Vitals/Pain Pain Assessment: 0-10 Pain Score: 5  Pain Location: right knee Pain Descriptors / Indicators: Operative site guarding;Sore;Tightness Pain Intervention(s): Monitored during session;Repositioned;Ice applied    Home Living                      Prior Function            PT Goals (current goals can now be found in the care plan section) Acute Rehab PT Goals Patient Stated Goal: to get back to golf Progress towards PT goals: Progressing toward goals  Frequency    7X/week      PT Plan      Co-evaluation             End of Session Equipment Utilized During Treatment: Gait belt Activity Tolerance: Patient tolerated treatment well Patient left: in chair;with nursing/sitter in room     Time: 1002-1041 PT Time Calculation (min) (ACUTE ONLY): 39 min  Charges:  $Gait Training: 8-22 mins $Therapeutic Exercise: 8-22 mins $Therapeutic Activity: 8-22 mins                     G Codes:      Rica Koyanagi  PTA [facility]  Acute  Rehab Pager      [phone]

## 2016-07-28 ENCOUNTER — Telehealth (INDEPENDENT_AMBULATORY_CARE_PROVIDER_SITE_OTHER): Payer: Self-pay | Admitting: *Deleted

## 2016-07-28 NOTE — Telephone Encounter (Signed)
Verbal left on VM for patient

## 2016-07-28 NOTE — Telephone Encounter (Signed)
Cindee Salt from [facility] called this afternoon in regards to needing verbal orders for physical therapy. The duration 1 week one and 3 week two. Thank you Erik's CB # [phone].

## 2016-08-04 ENCOUNTER — Ambulatory Visit (INDEPENDENT_AMBULATORY_CARE_PROVIDER_SITE_OTHER): Payer: Medicare HMO | Admitting: Orthopaedic Surgery

## 2016-08-04 DIAGNOSIS — Z96651 Presence of right artificial knee joint: Secondary | ICD-10-CM

## 2016-08-04 NOTE — Progress Notes (Signed)
The patient is 2 weeks status post a right total knee replacement. He is doing well overall but wants to be making more progress. He is 83 years old. He has not been happy with his home therapy.  On examination his calf is soft. He has been also relatives he can stop this at this point. His incisions well-healed and I removed the old Steri-Strips and placed new ones. He lacks full extension by about 5 neck in flexion and 90.  I gave him prescription to transition to outpatient physical therapy. He'll stop his relative. He can try Norco at this standpoint as well as Aleve. I'll see him back in a month to see how is doing overall but no x-rays are needed.

## 2016-08-22 ENCOUNTER — Telehealth (INDEPENDENT_AMBULATORY_CARE_PROVIDER_SITE_OTHER): Payer: Self-pay | Admitting: Orthopaedic Surgery

## 2016-08-22 NOTE — Telephone Encounter (Signed)
Patient aware of the below message  

## 2016-08-22 NOTE — Telephone Encounter (Signed)
Patient wants to know when he can drive?  Is it typical to not be abel to sleep after having knee replacement surgery?

## 2016-08-22 NOTE — Telephone Encounter (Signed)
I'm fine with him driving.  I don't tend to use sleep meds when people are on narcotics other than recommending benadryl or melatonin over the counter.

## 2016-08-22 NOTE — Telephone Encounter (Signed)
See below. Can he maybe have something for sleep?

## 2016-09-01 ENCOUNTER — Ambulatory Visit (INDEPENDENT_AMBULATORY_CARE_PROVIDER_SITE_OTHER): Payer: Medicare HMO | Admitting: Orthopaedic Surgery

## 2016-09-02 ENCOUNTER — Ambulatory Visit (INDEPENDENT_AMBULATORY_CARE_PROVIDER_SITE_OTHER): Payer: Medicare HMO | Admitting: Orthopaedic Surgery

## 2016-09-02 ENCOUNTER — Encounter (INDEPENDENT_AMBULATORY_CARE_PROVIDER_SITE_OTHER): Payer: Self-pay

## 2016-09-02 DIAGNOSIS — Z96651 Presence of right artificial knee joint: Secondary | ICD-10-CM

## 2016-09-02 NOTE — Progress Notes (Signed)
The patient is now 6 weeks status post a right total knee replacement. He is turned the corner in terms of improving his motion and doing better overall. He is very active 83 year old and he feels like he is Making Some Progress.  On Examination He Lacks Full Extension by about 3. I Can Flex Him to about 95 to 100. The Knee Feels Ligamentously Stable.  At this point I'll continue physical therapy and continued to increase his activities as comfort allows. He'll push himself a daily for range of motion that knee. I'll see him back in a month to see how is doing overall but no x-rays are needed. If he looks good at that visit and we will follow him up about 6 months after that with repeat films.

## 2016-09-25 ENCOUNTER — Telehealth (INDEPENDENT_AMBULATORY_CARE_PROVIDER_SITE_OTHER): Payer: Self-pay

## 2016-09-25 NOTE — Telephone Encounter (Signed)
Tell him there is unfortunately nothing we can do?

## 2016-09-25 NOTE — Telephone Encounter (Signed)
Please advise 

## 2016-09-25 NOTE — Telephone Encounter (Signed)
Knee replacement surgery can make the leg feel restless at night during the first few months

## 2016-09-25 NOTE — Telephone Encounter (Signed)
Nothing really to do but time

## 2016-09-25 NOTE — Telephone Encounter (Signed)
Patient states that he is having restless leg on the right and would like to know if it has anything to do with his surgery he had in February.  Please Advise. CB# is [phone]. Thank You

## 2016-09-25 NOTE — Telephone Encounter (Signed)
Patient aware of the below message  

## 2016-10-06 ENCOUNTER — Ambulatory Visit (INDEPENDENT_AMBULATORY_CARE_PROVIDER_SITE_OTHER): Payer: Medicare HMO | Admitting: Orthopaedic Surgery

## 2016-10-06 DIAGNOSIS — Z96651 Presence of right artificial knee joint: Secondary | ICD-10-CM

## 2016-10-06 MED ORDER — METHYLPREDNISOLONE 4 MG PO TABS: ORAL_TABLET | ORAL | 0 refills | Status: AC

## 2016-10-06 MED ORDER — DICLOFENAC SODIUM 1 % TD GEL: 2.0000 g | Freq: Four times a day (QID) | TRANSDERMAL | 3 refills | Status: DC

## 2016-10-06 NOTE — Progress Notes (Signed)
Shane Jordan is now 10 weeks status post a right total knee arthroplasty. No children therapy said he has made progress and graduated from physical therapy. He is working on home exercise program. He is someone who does a lot of yard work and is one area back to regular golfing as well. He's been having some left hip pain he points the trochanteric area and IT band as source of pain. His wife said they have a lot of uneven surfaces at home.  On examination of his right knee there is some mild swelling. He lacks full extension by maybe 30 and his flexion is about 110. The knee feels ligaments stable. He does have pain over his trochanteric area and IT band on the left side.  I showed him stretching exercises for the trochanteric area and IT band syndrome. I will send in some Voltaren gel and a steroid taper. He'll still work on home exercise program as well as in terms of knee motion and strengthening. See him back in 6 months and will have AP and lateral of his right operative knee at that visit.

## 2016-11-07 NOTE — Addendum Note (Signed)
Addendum  created 11/07/16 1120 by Rica Koyanagi, MD   Sign clinical note

## 2017-04-06 ENCOUNTER — Ambulatory Visit (INDEPENDENT_AMBULATORY_CARE_PROVIDER_SITE_OTHER): Payer: Medicare HMO | Admitting: Orthopaedic Surgery

## 2017-04-06 ENCOUNTER — Ambulatory Visit (INDEPENDENT_AMBULATORY_CARE_PROVIDER_SITE_OTHER): Payer: Medicare HMO

## 2017-04-06 DIAGNOSIS — M25552 Pain in left hip: Secondary | ICD-10-CM

## 2017-04-06 DIAGNOSIS — Z96651 Presence of right artificial knee joint: Secondary | ICD-10-CM

## 2017-04-06 NOTE — Progress Notes (Signed)
Office Visit Note   Patient: Shane Jordan           Date of Birth: 1932-07-05           MRN: [id number] Visit Date: 04/06/2017              Requested by: Reita Cliche, MD No address on file PCP: Reita Cliche, MD   Assessment & Plan: Visit Diagnoses:  1. History of total right knee replacement   2. Pain in left hip     Plan: Although he does have some arthritic changes in his left hip, I do feel that this is more of a trochanteric bursitis.  I did show him some stretching exercises to try for this.  We will see him back at the one-year follow-up from his total knee arthroplasty but no x-rays will be needed.  Obviously if he needs an injection before then he will let us know.  He was able to demonstrate the stretching exercises for his hip bursitis back to me.  Follow-Up Instructions: Return in about 4 months (around 08/06/2017).   Orders:  Orders Placed This Encounter  Procedures  . XR Knee 1-2 Views Right  . XR HIP UNILAT W OR W/O PELVIS 1V LEFT   No orders of the defined types were placed in this encounter.     Procedures: No procedures performed   Clinical Data: No additional findings.   Subjective: No chief complaint on file. The patient is now 8 months status post a right total knee arthroplasty.  Is been having some occasional pain and swelling but doing well overall.  He is been having some left hip pain he points to trochanteric area some of the normal and his source of his left hip pain.  He got bad enough last week where he was having problems walking on it and at rest.  He is a former is been doing a lot of outdoor activities as well.  Is 83 years old.  He denies any locking catching of the right knee or the left hip.   HPI  Review of Systems He denies any headache, chest pain, shortness of breath, fever, chills, nausea, vomiting.  Objective: Vital Signs: There were no vitals taken for this visit.  Physical Exam He is alert and oriented x3  in no acute distress Ortho Exam Examination of his right knee does show mild effusion but there is no redness.  Incision is well-healed.  His range of motion is full and strong.  Knee feels ligamentously stable.  Examination of his left hip shows pain over the IT band of the trochanteric area and not in the groin with full range of motion of the left hip.  Although he has some mild arthritic changes in his left hip Specialty Comments:  No specialty comments available.  Imaging: Xr Hip Unilat W Or W/o Pelvis 1v Left  Result Date: 04/06/2017 An AP pelvis and a lateral of his left hip shows mild arthritic changes in the superior lateral aspect of the hip joint.  There are periarticular osteophytes.  Xr Knee 1-2 Views Right  Result Date: 04/06/2017 2 views of the right knee show a well-seated total knee arthroplasty with no comp getting features no evidence of loosening    PMFS History: Patient Active Problem List   Diagnosis Date Noted  . Unilateral primary osteoarthritis, right knee 07/22/2016  . Status post total right knee replacement 07/22/2016   Past Medical History:  Diagnosis Date  . Arthritis   .  GERD (gastroesophageal reflux disease)   . Hypertension   . Hypothyroidism   . Malignant melanoma of abdomen ([place]) 1990s   "went to [facility]; took shots & was tested ~ q3 months; after 2 1/2 years they said to go home and do well"    No family history on file.  Past Surgical History:  Procedure Laterality Date  . COLONOSCOPY    . JOINT REPLACEMENT    . KNEE CARTILAGE SURGERY Left 1970s?  Marland Kitchen MELANOMA EXCISION  1990s   abdomen  . MULTIPLE TOOTH EXTRACTIONS    . TONSILLECTOMY    . TOTAL KNEE ARTHROPLASTY Right 07/22/2016  . TOTAL KNEE ARTHROPLASTY Right 07/22/2016   Procedure: RIGHT TOTAL KNEE ARTHROPLASTY;  Surgeon: Mcarthur Rossetti, MD;  Location: [place];  Service: Orthopedics;  Laterality: Right;   Social History   Occupational History  . Not on file.    Social History Main Topics  . Smoking status: Former Smoker    Types: Cigars  . Smokeless tobacco: Former Systems developer    Types: Chew  . Alcohol use No  . Drug use: No  . Sexual activity: Not on file

## 2017-05-05 ENCOUNTER — Ambulatory Visit (INDEPENDENT_AMBULATORY_CARE_PROVIDER_SITE_OTHER): Payer: Medicare HMO | Admitting: Orthopaedic Surgery

## 2017-08-05 ENCOUNTER — Ambulatory Visit (INDEPENDENT_AMBULATORY_CARE_PROVIDER_SITE_OTHER): Payer: Medicare HMO | Admitting: Orthopaedic Surgery

## 2017-08-05 ENCOUNTER — Encounter (INDEPENDENT_AMBULATORY_CARE_PROVIDER_SITE_OTHER): Payer: Self-pay | Admitting: Orthopaedic Surgery

## 2017-08-05 DIAGNOSIS — Z96651 Presence of right artificial knee joint: Secondary | ICD-10-CM

## 2017-08-05 NOTE — Progress Notes (Signed)
The patient is now a year out from a right total knee arthroplasty.  He says he is doing well overall but it does sometimes swell and hurt when he is not working on his mobility with the knee.  He is 84 years old.  On exam he has full extension to about 95 degrees flexion of the right knee.  It feels ligamentously stable.  Incisions well-healed.  Swelling is minimal.  We talked about extension exercises and straight leg raises and getting the knees as strong as possible.  All questions concerns were answered and addressed.  He says his left knee is not ready to be addressed yet but he would let us know if it comes an issue.  She will otherwise follow-up as needed.  If he does come in again with any type of knee issues we would x-ray both knees.

## 2017-10-22 IMAGING — DX DG KNEE 1-2V*R*
2 series · 2 of 2 positions shown · non-contrast
Comparison: None.

CLINICAL DATA: Chronic right knee pain

EXAM:
RIGHT KNEE - 1-2 VIEW

[knee ap]
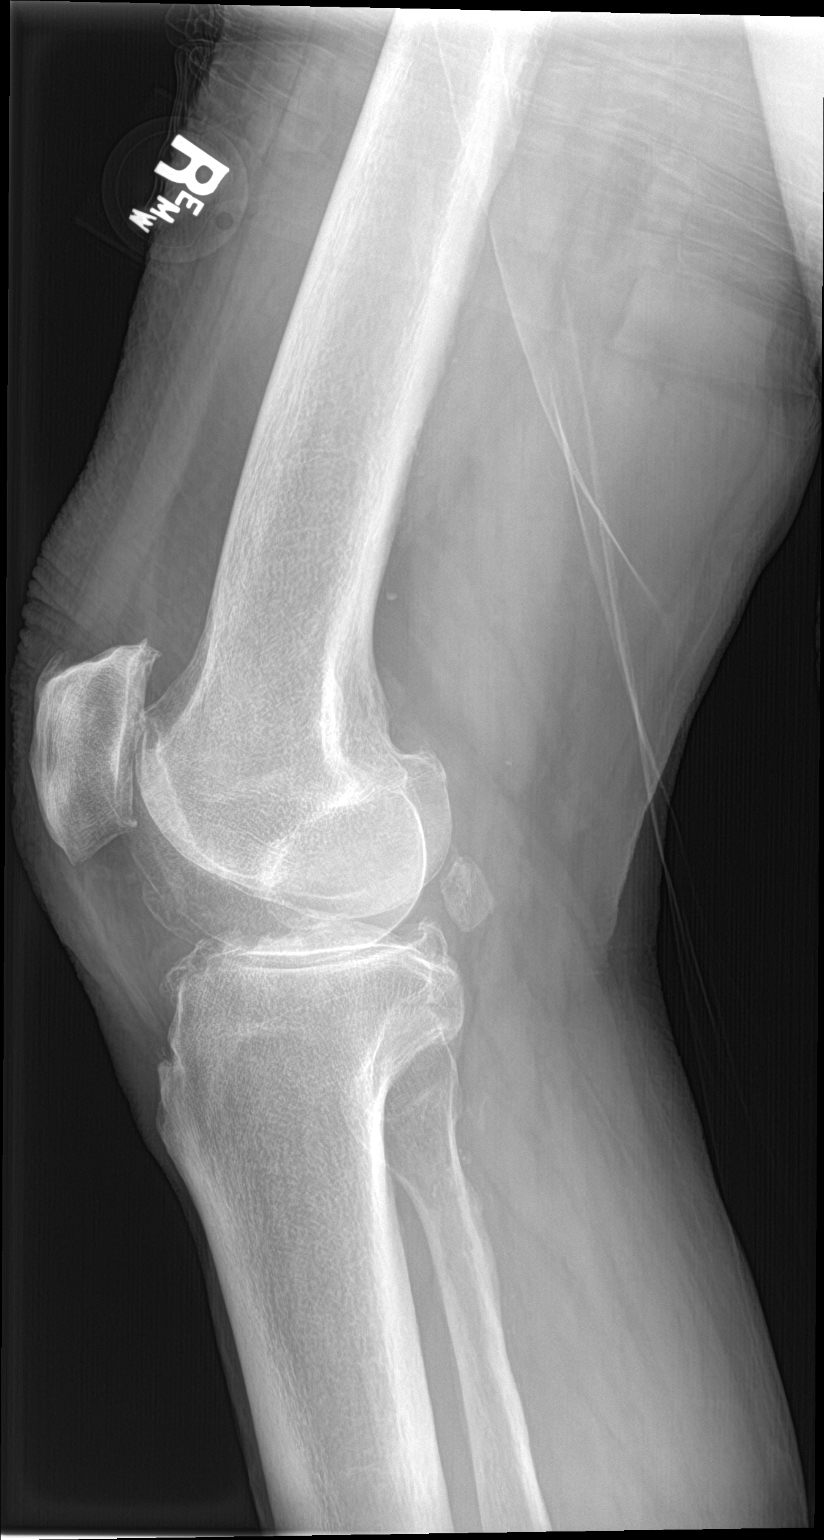

[knee lat]
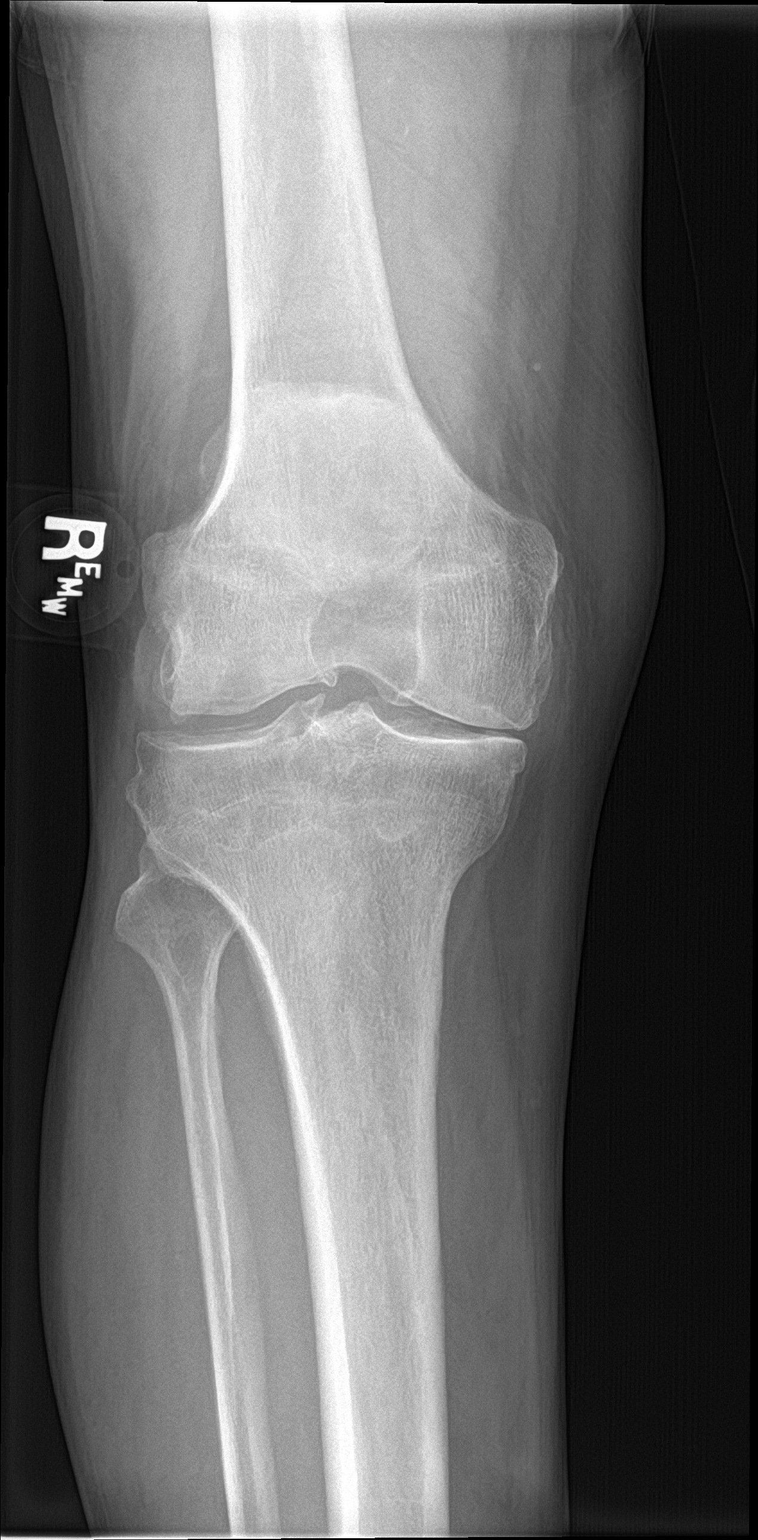

[2 of 2 positions shown; findings below may reference images not displayed]

FINDINGS: Two views of the right knee submitted. There is narrowing of medial
joint compartment. Narrowing of patellofemoral joint space. Mild
chondrocalcinosis. Small joint effusion. Spurring of patella. No
acute fracture or subluxation.
IMPRESSION: No acute fracture or subluxation. Osteoarthritic changes as
described above. Chondrocalcinosis. Small joint effusion.

## 2018-02-19 IMAGING — CR DG KNEE 1-2V PORT*R*
2 series · 2 of 2 positions shown · non-contrast
Comparison: 03/24/2016.

CLINICAL DATA: 84-year-old male post right knee replacement.
Initial encounter.

EXAM:
PORTABLE RIGHT KNEE - 1-2 VIEW

[ap]
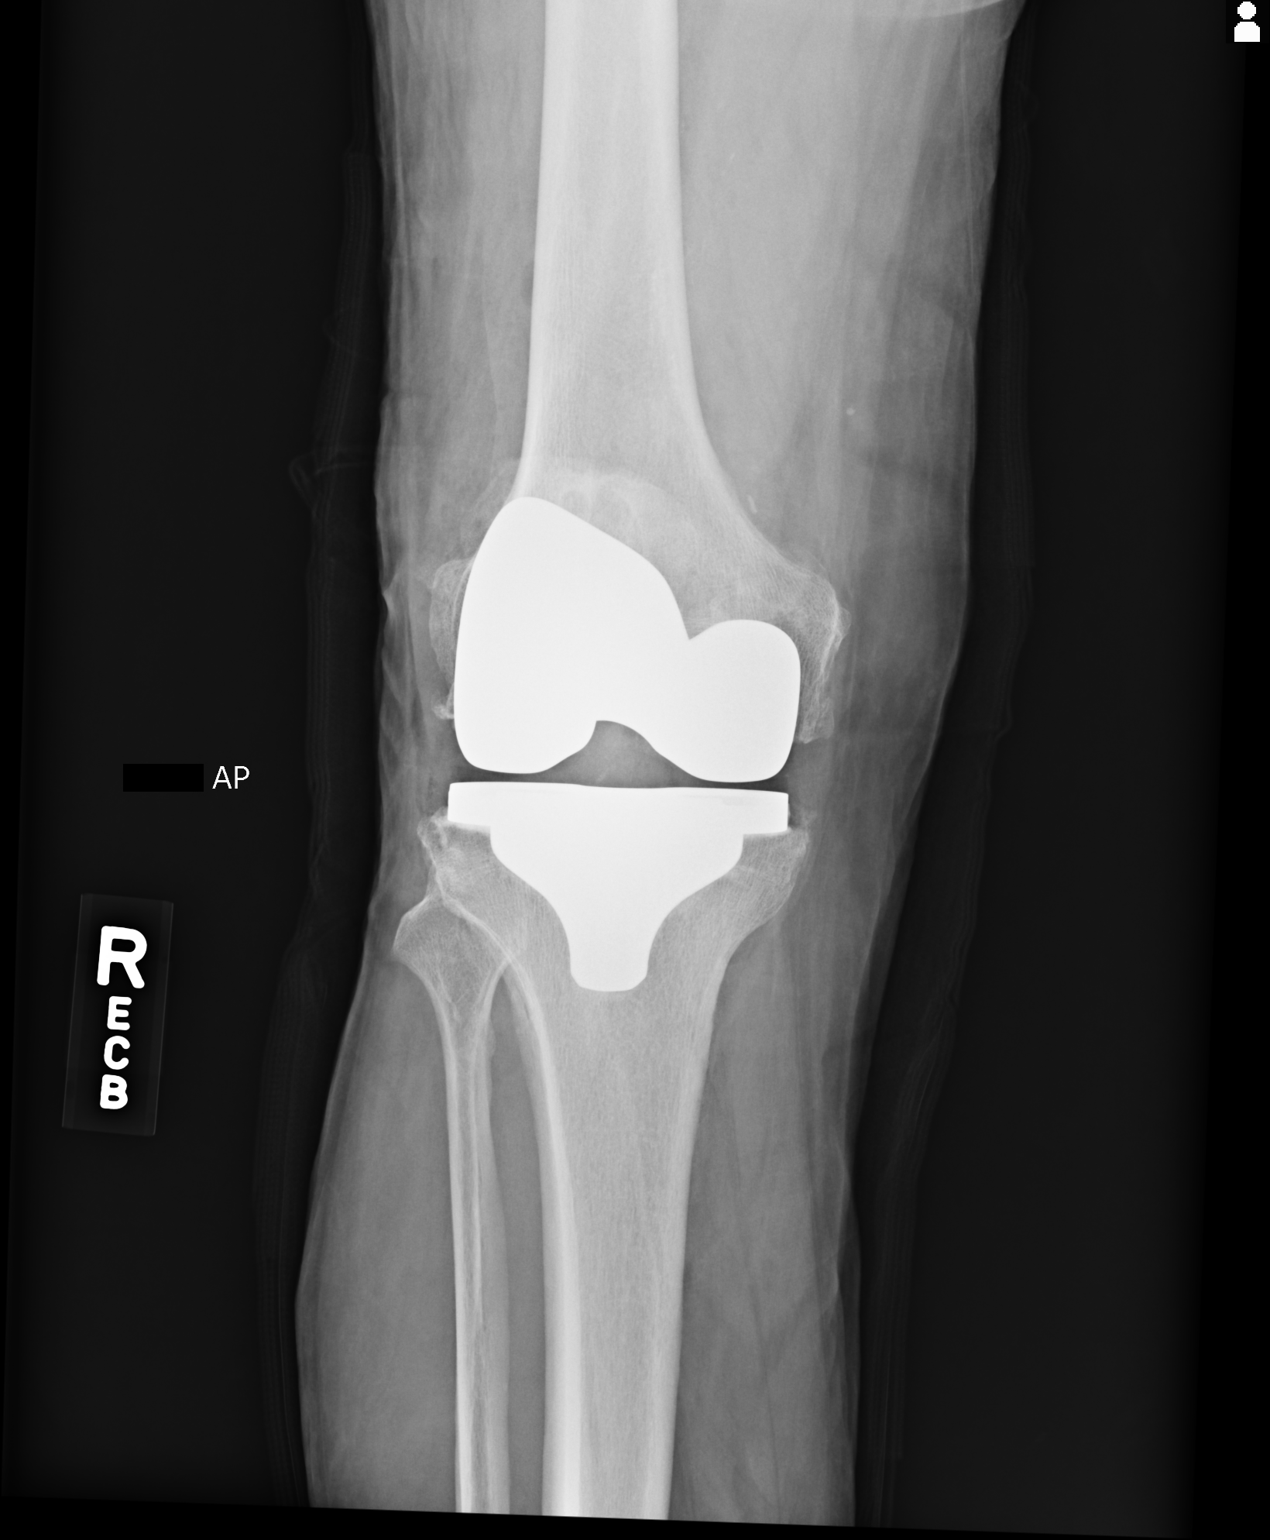

[lat]
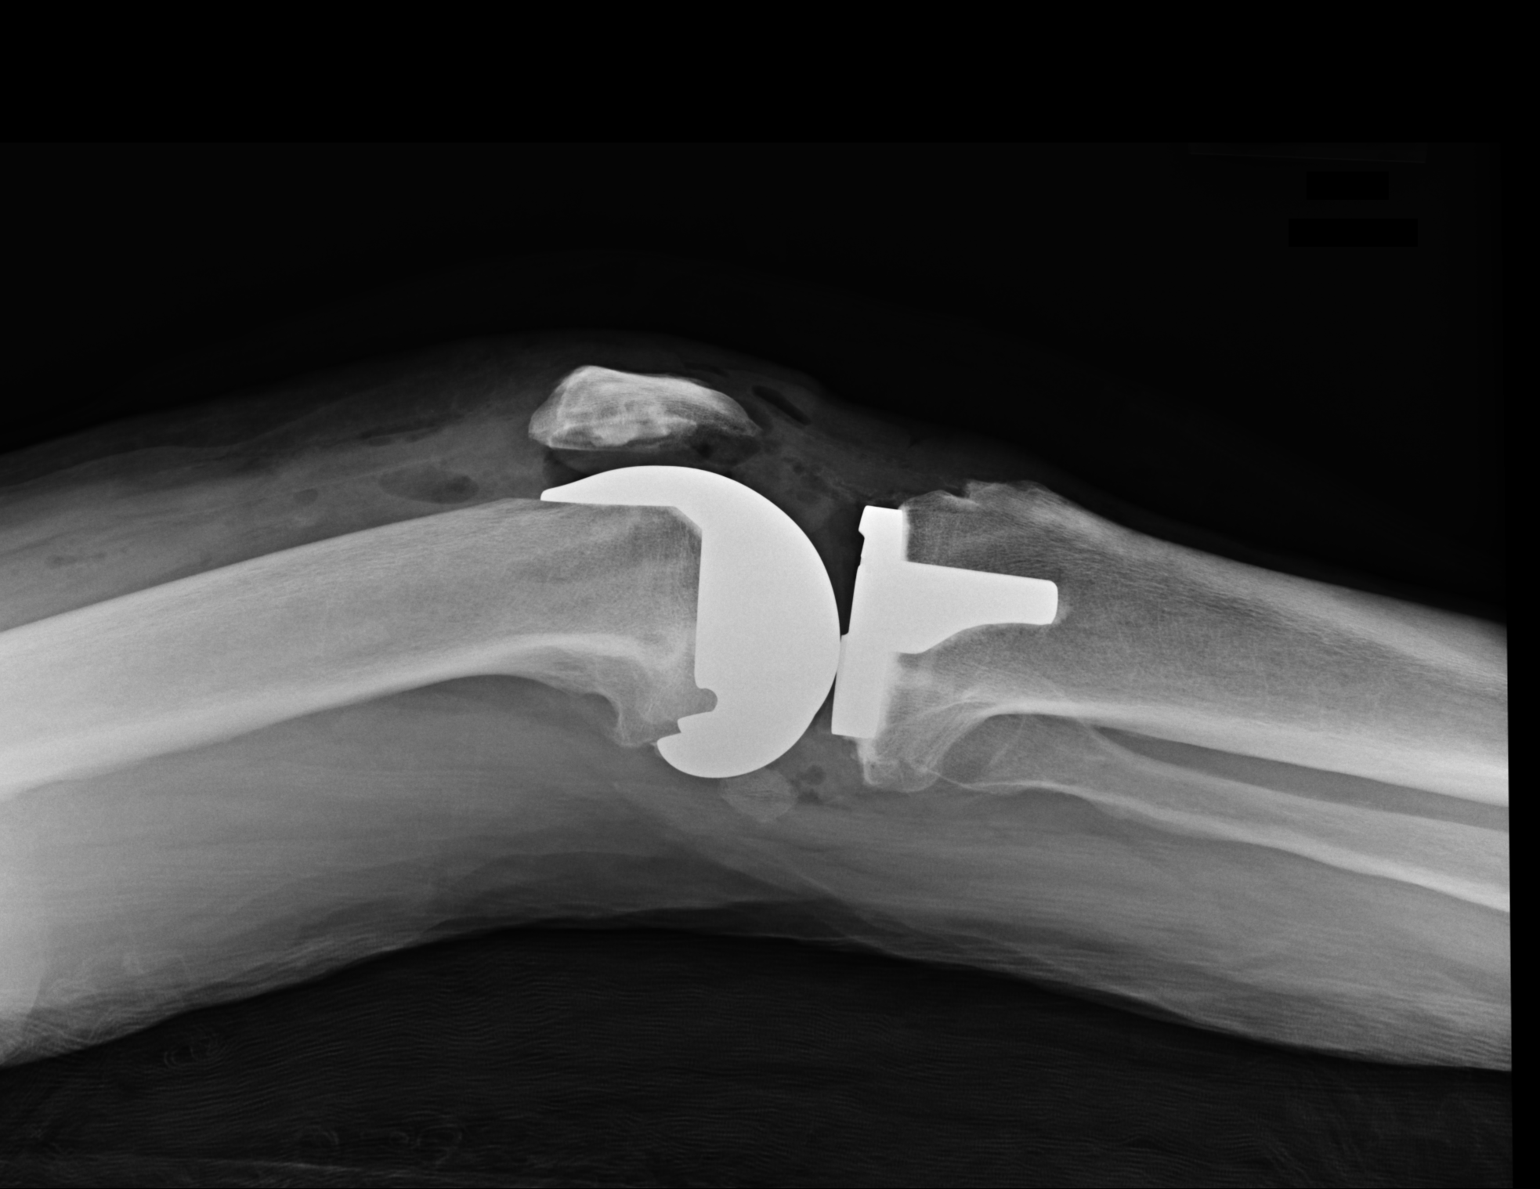

[2 of 2 positions shown; findings below may reference images not displayed]

FINDINGS: Post total right knee replacement appearing in satisfactory position
without complication noted.
IMPRESSION: Post total right knee replacement without complication noted.

## 2018-07-27 ENCOUNTER — Encounter (INDEPENDENT_AMBULATORY_CARE_PROVIDER_SITE_OTHER): Payer: Self-pay | Admitting: Orthopedic Surgery

## 2018-07-27 ENCOUNTER — Ambulatory Visit (INDEPENDENT_AMBULATORY_CARE_PROVIDER_SITE_OTHER): Payer: Medicare HMO | Admitting: Orthopedic Surgery

## 2018-07-27 ENCOUNTER — Ambulatory Visit (INDEPENDENT_AMBULATORY_CARE_PROVIDER_SITE_OTHER): Payer: Medicare HMO

## 2018-07-27 VITALS — Ht 69.0 in | Wt 175.0 lb

## 2018-07-27 DIAGNOSIS — M79672 Pain in left foot: Secondary | ICD-10-CM | POA: Diagnosis not present

## 2018-07-27 DIAGNOSIS — M6702 Short Achilles tendon (acquired), left ankle: Secondary | ICD-10-CM | POA: Diagnosis not present

## 2018-08-02 ENCOUNTER — Encounter (INDEPENDENT_AMBULATORY_CARE_PROVIDER_SITE_OTHER): Payer: Self-pay | Admitting: Orthopedic Surgery

## 2018-08-02 NOTE — Progress Notes (Signed)
Office Visit Note   Patient: Shane Jordan           Date of Birth: [birth year]           MRN: [id number] Visit Date: 07/27/2018              Requested by: Reita Cliche, MD No address on file PCP: Reita Cliche, MD  Chief Complaint  Patient presents with  . Left Foot - Pain      HPI: Patient is a 85 year old gentleman who is seen for left foot pain across the ball of his foot.  Patient states he has had symptoms since December of last year he states he has pain when he is up on his foot for prolonged periods of time.  Assessment & Plan: Visit Diagnoses:  1. Left foot pain   2. Contracture of left Achilles tendon     Plan: Patient does have Achilles contracture on the left which seems to be causing the overloading across the metatarsal heads.  Recommend orthotics to get pressure back on the arch and off the ball of his foot stiff soled sneakers as well as Achilles stretching which was demonstrated.  Discussed that if patient cannot resolve the symptoms without surgery surgical intervention would be Weil osteotomy for metatarsals 2 3 and 4 with gastrocnemius recession.  Follow-Up Instructions: Return if symptoms worsen or fail to improve.   Ortho Exam  Patient is alert, oriented, no adenopathy, well-dressed, normal affect, normal respiratory effort. Examination patient has good pulses he has dorsiflexion on the right to 20 degrees past neutral with his knee extended he only has dorsiflexion to neutral on the left.  He has clawing of the toes and prominent callus beneath the fourth metatarsal head.  Radiographs shows a long second third and fourth metatarsal with the fourth metatarsal being most prominent.  Imaging: No results found. No images are attached to the encounter.  Labs: No results found for: HGBA1C, ESRSEDRATE, CRP, LABURIC, REPTSTATUS, GRAMSTAIN, CULT, LABORGA   No results found for: ALBUMIN, PREALBUMIN, LABURIC  Body mass index is 25.84  kg/m.  Orders:  Orders Placed This Encounter  Procedures  . XR Foot 2 Views Left   No orders of the defined types were placed in this encounter.    Procedures: No procedures performed  Clinical Data: No additional findings.  ROS:  All other systems negative, except as noted in the HPI. Review of Systems  Objective: Vital Signs: Ht 5\' 9"  (1.753 m)   Wt 175 lb (79.4 kg)   BMI 25.84 kg/m   Specialty Comments:  No specialty comments available.  PMFS History: Patient Active Problem List   Diagnosis Date Noted  . Unilateral primary osteoarthritis, right knee 07/22/2016  . Status post total right knee replacement 07/22/2016   Past Medical History:  Diagnosis Date  . Arthritis   . GERD (gastroesophageal reflux disease)   . Hypertension   . Hypothyroidism   . Malignant melanoma of abdomen ([place]) 1990s   "went to [facility]; took shots & was tested ~ q3 months; after 2 1/2 years they said to go home and do well"    History reviewed. No pertinent family history.  Past Surgical History:  Procedure Laterality Date  . COLONOSCOPY    . JOINT REPLACEMENT    . KNEE CARTILAGE SURGERY Left 1970s?  Marland Kitchen MELANOMA EXCISION  1990s   abdomen  . MULTIPLE TOOTH EXTRACTIONS    . TONSILLECTOMY    . TOTAL KNEE ARTHROPLASTY  Right 07/22/2016  . TOTAL KNEE ARTHROPLASTY Right 07/22/2016   Procedure: RIGHT TOTAL KNEE ARTHROPLASTY;  Surgeon: Mcarthur Rossetti, MD;  Location: [place];  Service: Orthopedics;  Laterality: Right;   Social History   Occupational History  . Not on file  Tobacco Use  . Smoking status: Former Smoker    Types: Cigars  . Smokeless tobacco: Former Systems developer    Types: Chew  Substance and Sexual Activity  . Alcohol use: No  . Drug use: No  . Sexual activity: Not on file

## 2019-06-27 ENCOUNTER — Ambulatory Visit: Payer: Medicare HMO | Admitting: Physician Assistant

## 2019-06-27 ENCOUNTER — Ambulatory Visit: Payer: Medicare HMO | Admitting: Orthopedic Surgery

## 2019-06-27 ENCOUNTER — Other Ambulatory Visit: Payer: Self-pay

## 2019-06-27 ENCOUNTER — Ambulatory Visit (INDEPENDENT_AMBULATORY_CARE_PROVIDER_SITE_OTHER): Payer: Medicare HMO

## 2019-06-27 ENCOUNTER — Encounter: Payer: Self-pay | Admitting: Physician Assistant

## 2019-06-27 DIAGNOSIS — M25561 Pain in right knee: Secondary | ICD-10-CM | POA: Diagnosis not present

## 2019-06-27 MED ORDER — DICLOFENAC SODIUM 1 % EX GEL: 4.0000 g | Freq: Four times a day (QID) | CUTANEOUS | 1 refills | Status: AC

## 2019-06-27 NOTE — Progress Notes (Signed)
Office Visit Note   Patient: Shane Jordan           Date of Birth: August 23, 1932           MRN: [id number] Visit Date: 06/27/2019              Requested by: Reita Cliche, MD No address on file PCP: Reita Cliche, MD   Assessment & Plan: Visit Diagnoses:  1. Acute pain of right knee     Plan: We will see him back in 4 weeks for reevaluation of the knee.  In the interim like for him to use Voltaren gel over the head of the fibula.  Also to work on Forensic scientist.  Questions encouraged and answered.  Follow-Up Instructions: Return in about 4 weeks (around 07/25/2019).   Orders:  Orders Placed This Encounter  Procedures  . XR Knee 1-2 Views Right   Meds ordered this encounter  Medications  . diclofenac Sodium (VOLTAREN) 1 % GEL    Sig: Apply 4 g topically 4 (four) times daily.    Dispense:  150 g    Refill:  1      Procedures: No procedures performed   Clinical Data: No additional findings.   Subjective: Chief Complaint  Patient presents with  . Right Knee - Pain    HPI Shane Jordan is 86 year old male now 2 years 11 months status post right total knee arthroplasty.    He notes 3 to 5 days ago he started having pain lateral aspect of the knee.  No known injury.  He notes swelling of the knee.  He does walk a lot.  He is wanting to get back to golfing as the weather improves.  He has tried no treatment for the knee pain.  Notes a clicking of the knee but otherwise no mechanical symptoms in the knee. Review of Systems Negative for fevers chills shortness of breath chest pain  Objective: Vital Signs: There were no vitals taken for this visit.  Physical Exam Constitutional:      Appearance: He is not ill-appearing or diaphoretic.  Pulmonary:     Effort: Pulmonary effort is normal.  Neurological:     Mental Status: He is alert and oriented to person, place, and time.  Psychiatric:        Behavior: Behavior normal.     Ortho Exam Right knee:  Good range of motion.  Forced flexion causes pain lateral aspect of the knee.  He has tenderness over the left knee at the level of the fibular head.  No joint line tenderness.  No instability valgus varus stressing of the knee.  Anterior drawer is negative.  No abnormal warmth erythema or effusion. Specialty Comments:  No specialty comments available.  Imaging: XR Knee 1-2 Views Right  Result Date: 06/27/2019 Right knee AP lateral: No acute fractures.  No bony abnormalities.  Well-seated right total knee arthroplasty without any evidence of loosening or hardware failure.      PMFS History: Patient Active Problem List   Diagnosis Date Noted  . Unilateral primary osteoarthritis, right knee 07/22/2016  . Status post total right knee replacement 07/22/2016   Past Medical History:  Diagnosis Date  . Arthritis   . GERD (gastroesophageal reflux disease)   . Hypertension   . Hypothyroidism   . Malignant melanoma of abdomen ([place]) 1990s   "went to [facility]; took shots & was tested ~ q3 months; after 2 1/2 years they said to go home and  do well"    No family history on file.  Past Surgical History:  Procedure Laterality Date  . COLONOSCOPY    . JOINT REPLACEMENT    . KNEE CARTILAGE SURGERY Left 1970s?  Marland Kitchen MELANOMA EXCISION  1990s   abdomen  . MULTIPLE TOOTH EXTRACTIONS    . TONSILLECTOMY    . TOTAL KNEE ARTHROPLASTY Right 07/22/2016  . TOTAL KNEE ARTHROPLASTY Right 07/22/2016   Procedure: RIGHT TOTAL KNEE ARTHROPLASTY;  Surgeon: Mcarthur Rossetti, MD;  Location: [place];  Service: Orthopedics;  Laterality: Right;   Social History   Occupational History  . Not on file  Tobacco Use  . Smoking status: Former Smoker    Types: Cigars  . Smokeless tobacco: Former Systems developer    Types: Chew  Substance and Sexual Activity  . Alcohol use: No  . Drug use: No  . Sexual activity: Not on file

## 2019-07-20 ENCOUNTER — Other Ambulatory Visit: Payer: Self-pay

## 2019-07-20 ENCOUNTER — Ambulatory Visit (INDEPENDENT_AMBULATORY_CARE_PROVIDER_SITE_OTHER): Payer: Medicare HMO | Admitting: Orthopaedic Surgery

## 2019-07-20 DIAGNOSIS — M25461 Effusion, right knee: Secondary | ICD-10-CM

## 2019-07-20 DIAGNOSIS — Z96651 Presence of right artificial knee joint: Secondary | ICD-10-CM

## 2019-07-20 MED ORDER — LIDOCAINE HCL 1 % IJ SOLN
3.0000 mL | INTRAMUSCULAR | Status: AC | PRN
  Administered 2019-07-20: 3 mL

## 2019-07-20 MED ORDER — METHYLPREDNISOLONE ACETATE 40 MG/ML IJ SUSP
40.0000 mg | INTRAMUSCULAR | Status: AC | PRN
  Administered 2019-07-20: 14:00:00 40 mg via INTRA_ARTICULAR

## 2019-07-20 NOTE — Progress Notes (Signed)
Office Visit Note   Patient: Shane Jordan           Date of Birth: 27-Apr-1933           MRN: [id number] Visit Date: 07/20/2019              Requested by: Reita Cliche, MD No address on file PCP: Reita Cliche, MD   Assessment & Plan: Visit Diagnoses:  1. Effusion, right knee   2. Status post total right knee replacement     Plan: I did aspirate some fluid from the knee and only got 10 cc of clear fluid from his knee.  I placed a one-time steroid shot in his knee joint.  I did recommend a MRI with contrast of the right knee and distal femur but he wants to wait to see how he feels in 2 weeks.  We will see him back in 2 weeks from now for repeat exam.  No x-rays are needed.  If he continues to have significant pain we would obtain an MRI of the distal femur with and without contrast.  If things worsen before then he will also need to let us know.  All question concerns were answered and addressed.  Follow-Up Instructions: Return in about 2 weeks (around 08/03/2019).   Orders:  Orders Placed This Encounter  Procedures  . Large Joint Inj   No orders of the defined types were placed in this encounter.     Procedures: Large Joint Inj: R knee on 07/20/2019 1:35 PM Indications: diagnostic evaluation and pain Details: 22 G 1.5 in needle, superolateral approach  Arthrogram: No  Medications: 3 mL lidocaine 1 %; 40 mg methylPREDNISolone acetate 40 MG/ML Outcome: tolerated well, no immediate complications Procedure, treatment alternatives, risks and benefits explained, specific risks discussed. Consent was given by the patient. Immediately prior to procedure a time out was called to verify the correct patient, procedure, equipment, support staff and site/side marked as required. Patient was prepped and draped in the usual sterile fashion.       Clinical Data: No additional findings.   Subjective: Chief Complaint  Patient presents with  . Right Knee - Pain  The  patient comes in with continued right knee pain.  He is getting ready to be 3 years out from a total knee arthroplasty.  It is only been recent acute pain with his right knee.  He was here a few weeks ago we recommended Voltaren gel.  X-rays did not show any gross deformities per Logan Bores note and office visit.  He has mainly mechanical type of pain.  He has been waking up at night.  He is concerned about a little bit of thigh pain he is having. He has no other histories of cancers or any acute medical issues.  He is very active 86 years old. HPI  Review of Systems He currently denies any headache, chest pain, shortness of breath, fever, chills, nausea, vomiting  Objective: Vital Signs: There were no vitals taken for this visit.  Physical Exam He is alert and orient x3 and in no acute distress. Ortho Exam He does ambulate with a slight limp favoring his right knee.  There is no significant knee joint effusion.  The knee is not warm.  His ligaments are stable full range of motion.  He does have some pain when I stressed the distal thigh.  There is some firmness in the soft tissue that is just mild. Specialty Comments:  No specialty comments  available.  Imaging: No results found. I did review x-rays from 2 weeks ago of his right total knee arthroplasty.  It looks like the implant is well-seated.  There is some slight calcifications in the soft tissue at the anterior medial distal femur.  I am not sure if there is a lesion I am seeing the bone or not.  PMFS History: Patient Active Problem List   Diagnosis Date Noted  . Unilateral primary osteoarthritis, right knee 07/22/2016  . Status post total right knee replacement 07/22/2016   Past Medical History:  Diagnosis Date  . Arthritis   . GERD (gastroesophageal reflux disease)   . Hypertension   . Hypothyroidism   . Malignant melanoma of abdomen ([place]) 1990s   "went to [facility]; took shots & was tested ~ q3 months; after 2 1/2  years they said to go home and do well"    No family history on file.  Past Surgical History:  Procedure Laterality Date  . COLONOSCOPY    . JOINT REPLACEMENT    . KNEE CARTILAGE SURGERY Left 1970s?  Marland Kitchen MELANOMA EXCISION  1990s   abdomen  . MULTIPLE TOOTH EXTRACTIONS    . TONSILLECTOMY    . TOTAL KNEE ARTHROPLASTY Right 07/22/2016  . TOTAL KNEE ARTHROPLASTY Right 07/22/2016   Procedure: RIGHT TOTAL KNEE ARTHROPLASTY;  Surgeon: Mcarthur Rossetti, MD;  Location: [place];  Service: Orthopedics;  Laterality: Right;   Social History   Occupational History  . Not on file  Tobacco Use  . Smoking status: Former Smoker    Types: Cigars  . Smokeless tobacco: Former Systems developer    Types: Chew  Substance and Sexual Activity  . Alcohol use: No  . Drug use: No  . Sexual activity: Not on file

## 2019-07-25 ENCOUNTER — Telehealth: Payer: Self-pay | Admitting: Orthopaedic Surgery

## 2019-07-25 ENCOUNTER — Other Ambulatory Visit: Payer: Self-pay

## 2019-07-25 DIAGNOSIS — Z96651 Presence of right artificial knee joint: Secondary | ICD-10-CM

## 2019-07-25 DIAGNOSIS — M25461 Effusion, right knee: Secondary | ICD-10-CM

## 2019-07-25 NOTE — Telephone Encounter (Signed)
Order sent for MRI

## 2019-07-25 NOTE — Telephone Encounter (Signed)
Patient called.   He said an MRI was discussed at his last appointment in the event that his issue didn't get better. He is wanting to proceed with the MRI.   Call back number: [phone]

## 2019-07-29 ENCOUNTER — Other Ambulatory Visit: Payer: Self-pay | Admitting: *Deleted

## 2019-07-29 DIAGNOSIS — M25461 Effusion, right knee: Secondary | ICD-10-CM

## 2019-08-03 ENCOUNTER — Ambulatory Visit: Payer: Medicare HMO | Admitting: Orthopaedic Surgery

## 2019-08-03 ENCOUNTER — Telehealth: Payer: Self-pay | Admitting: Orthopaedic Surgery

## 2019-08-03 NOTE — Telephone Encounter (Signed)
Gabriel Cirri s/w patient

## 2019-08-03 NOTE — Telephone Encounter (Signed)
Needs a call the wife to talk about the lab work.  Please call @[phone]

## 2019-08-03 NOTE — Telephone Encounter (Signed)
Have tried to call patient back, it's just a busy signal

## 2019-08-08 ENCOUNTER — Other Ambulatory Visit: Payer: Self-pay

## 2019-08-08 ENCOUNTER — Telehealth: Payer: Self-pay | Admitting: Radiology

## 2019-08-08 ENCOUNTER — Ambulatory Visit (INDEPENDENT_AMBULATORY_CARE_PROVIDER_SITE_OTHER): Payer: Medicare HMO

## 2019-08-08 ENCOUNTER — Other Ambulatory Visit: Payer: Self-pay | Admitting: Orthopaedic Surgery

## 2019-08-08 DIAGNOSIS — Z96651 Presence of right artificial knee joint: Secondary | ICD-10-CM | POA: Diagnosis not present

## 2019-08-08 DIAGNOSIS — D499 Neoplasm of unspecified behavior of unspecified site: Secondary | ICD-10-CM

## 2019-08-08 DIAGNOSIS — M25461 Effusion, right knee: Secondary | ICD-10-CM

## 2019-08-08 MED ORDER — GADOBUTROL 1 MMOL/ML IV SOLN
8.0000 mL | Freq: Once | INTRAVENOUS | Status: AC | PRN
  Administered 2019-08-08: 8 mL via INTRAVENOUS

## 2019-08-08 NOTE — Progress Notes (Signed)
I have spoken to the patient and his wife in length in detail.  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  This seems to correlate with his clinical exam of the firmness I felt in the soft tissues.  I did review the plain films that we had of the right knee.  His knee replacement looks good.  There is a lytic area in the distal femur shaft and some calcifications in the soft tissue.  I picked up on the soft tissue calcifications but did not fully appreciate the lytic area in the distal femur that is quite obvious now and seeing it again.  I am certainly glad we went ahead and obtained a MRI.  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

## 2019-08-08 NOTE — Telephone Encounter (Signed)
[facility] called with call report on MRI Right Knee. Report pulled and placed at your desk for review.

## 2019-08-09 ENCOUNTER — Telehealth: Payer: Self-pay | Admitting: Orthopaedic Surgery

## 2019-08-09 NOTE — Telephone Encounter (Signed)
Patient's daughter Jeannene Patella called regarding MRI. Need Dr. Ninfa Linden to call back with results of MRI review. Mrs. Laqueta Due phone number is [phone].

## 2019-08-10 ENCOUNTER — Telehealth: Payer: Self-pay | Admitting: Orthopaedic Surgery

## 2019-08-10 ENCOUNTER — Ambulatory Visit: Payer: Medicare HMO | Admitting: Orthopaedic Surgery

## 2019-08-10 NOTE — Telephone Encounter (Signed)
Patient's wife Wells Guiles called asked for a call back concerning her husband being referred to [facility] for surgery on his  Right leg. She advised she just want to know what to expect and when they will be call for an appointment. The number to contact Wells Guiles is [phone] or [phone]

## 2019-08-12 ENCOUNTER — Telehealth: Payer: Self-pay | Admitting: Radiology

## 2019-08-12 NOTE — Telephone Encounter (Signed)
CD placed at front desk.

## 2019-08-12 NOTE — Telephone Encounter (Signed)
Please make CD of XR and MRI Knee for patient. Will pick up today around 1pm. Thank you!

## 2019-10-26 ENCOUNTER — Encounter: Payer: Self-pay | Admitting: Orthopaedic Surgery

## 2020-07-23 ENCOUNTER — Other Ambulatory Visit: Payer: Self-pay | Admitting: Orthopaedic Surgery

## 2020-07-23 DIAGNOSIS — M25461 Effusion, right knee: Secondary | ICD-10-CM

## 2021-06-09 DEATH — deceased
